# Patient Record
Sex: Male | Born: 1935 | Race: Black or African American | Hispanic: No | Smoking: Former smoker
Health system: Southern US, Community
[De-identification: ages and names within clinical notes are randomized; demographics above are authoritative.]

## PROBLEM LIST (undated history)

## (undated) DIAGNOSIS — E7800 Pure hypercholesterolemia, unspecified: Secondary | ICD-10-CM

## (undated) DIAGNOSIS — I639 Cerebral infarction, unspecified: Secondary | ICD-10-CM

## (undated) DIAGNOSIS — I739 Peripheral vascular disease, unspecified: Secondary | ICD-10-CM

## (undated) DIAGNOSIS — G8929 Other chronic pain: Secondary | ICD-10-CM

## (undated) DIAGNOSIS — M109 Gout, unspecified: Secondary | ICD-10-CM

## (undated) DIAGNOSIS — I509 Heart failure, unspecified: Secondary | ICD-10-CM

## (undated) DIAGNOSIS — M1990 Unspecified osteoarthritis, unspecified site: Secondary | ICD-10-CM

## (undated) DIAGNOSIS — M545 Low back pain, unspecified: Secondary | ICD-10-CM

## (undated) DIAGNOSIS — I10 Essential (primary) hypertension: Secondary | ICD-10-CM

## (undated) DIAGNOSIS — E119 Type 2 diabetes mellitus without complications: Secondary | ICD-10-CM

## (undated) DIAGNOSIS — K219 Gastro-esophageal reflux disease without esophagitis: Secondary | ICD-10-CM

## (undated) DIAGNOSIS — G4730 Sleep apnea, unspecified: Secondary | ICD-10-CM

## (undated) DIAGNOSIS — N289 Disorder of kidney and ureter, unspecified: Secondary | ICD-10-CM

## (undated) DIAGNOSIS — R0602 Shortness of breath: Secondary | ICD-10-CM

## (undated) HISTORY — PX: EYE SURGERY: SHX253

## (undated) HISTORY — PX: CHOLECYSTECTOMY: SHX55

## (undated) HISTORY — PX: COLONOSCOPY: SHX174

## (undated) HISTORY — PX: HUMERUS FRACTURE SURGERY: SHX670

## (undated) HISTORY — PX: PERITONEAL CATHETER INSERTION: SHX2223

---

## 2005-11-03 ENCOUNTER — Ambulatory Visit: Payer: Self-pay | Admitting: Cardiology

## 2011-02-22 HISTORY — PX: ARTERIOVENOUS GRAFT PLACEMENT: SUR1029

## 2011-02-26 ENCOUNTER — Ambulatory Visit (INDEPENDENT_AMBULATORY_CARE_PROVIDER_SITE_OTHER): Payer: Medicare Other | Admitting: Vascular Surgery

## 2011-02-26 ENCOUNTER — Encounter (INDEPENDENT_AMBULATORY_CARE_PROVIDER_SITE_OTHER): Payer: Medicare Other

## 2011-02-26 DIAGNOSIS — N189 Chronic kidney disease, unspecified: Secondary | ICD-10-CM

## 2011-02-26 DIAGNOSIS — Z01810 Encounter for preprocedural cardiovascular examination: Secondary | ICD-10-CM

## 2011-02-26 DIAGNOSIS — N186 End stage renal disease: Secondary | ICD-10-CM

## 2011-03-02 NOTE — Assessment & Plan Note (Signed)
OFFICE VISIT  [name], [name] DOB:  09/29/1934                                       02/26/2011 EXBMW#:[id number]  CHIEF COMPLAINT:  Needs dialysis access.  HISTORY OF PRESENT ILLNESS:  The patient is a 77 year old male referred by Dr. Kristian Covey for placement of a long-term hemodialysis access.  He is left-handed.  He has severe immobility of his right arm secondary to two previous fractures in the arm with poor healing.  He has minimal range of motion actively in the right arm.  OTHER CHRONIC MEDICAL PROBLEMS:  Diabetes, hypertension, elevated cholesterol, congestive heart failure.  The patient is currently not on dialysis.  PAST SURGICAL HISTORY:  Cholecystectomy.  SOCIAL HISTORY:  He is single.  He has two children.  He is retired.  He is a former smoker but quit 35 years ago.  FAMILY HISTORY:  Not remarkable for renal failure or early vascular disease.  REVIEW OF SYSTEMS:  He has multiple-joint arthritis pain.  He also has some shortness of breath with exertion.  He is 5 feet 7 inches, 229 pounds.  All other systems were negative.  Please see intake referral form for details.  MEDICATIONS:  Losartan, omeprazole, furosemide, loratadine, simvastatin, allopurinol, nifedipine, atenolol, Reglan, tramadol, vitamin D, sodium bicarbonate, Plavix 75 mg once a day, aspirin 325 mg once a day.  ALLERGIES:  He has allergy listed to Lortab.  PHYSICAL EXAMINATION:  Blood pressure is 123/74 in the left arm, heart rate is 57 and regular, respirations 18.  HEENT:  Unremarkable.  Neck has 2+ carotid pulses without bruit.  Chest is clear to auscultation. Abdomen is obese, soft, nontender, nondistended.  Cardiac:  Regular rate and rhythm without murmur.  Extremities:  The right upper extremity is able to passively abduct but not fully, and he is not really able to extend the arm enough that I think it would be easy to access a dialysis graft in the right arm.   The left upper extremity has 2+ brachial and 1+ radial pulse.  Neurologic:  He has 2/5 strength in the right upper extremity.  Left upper extremity and lower extremities are 5/5 motor strength.  Skin has no open ulcers or rashes.  He had a vein mapping ultrasound today, which showed that the cephalic vein and basilic veins are fairly marginal in the upper extremities, neither of which is suitable for fistula creation.  I believe the best option for the patient will be placement of the left arm AV graft.  This potentially would be an upper arm graft.  We have scheduled this for him on March 10, 2011.  I discussed with the patient and his son-in-law today risks, benefits and possible complications of the procedures, including but not limited to bleeding, infection, graft thrombosis, ischemic steal.  They understand and agree to proceed.    [name]. Noah Eastland, MD Electronically Signed  CEF/MEDQ  D:  02/27/2011  T:  03/02/2011  Job:  [id number]  cc:   Jorja Loa, M.D.

## 2011-03-05 ENCOUNTER — Other Ambulatory Visit (HOSPITAL_COMMUNITY): Payer: Medicare Other

## 2011-03-09 ENCOUNTER — Encounter (HOSPITAL_COMMUNITY)
Admission: RE | Admit: 2011-03-09 | Discharge: 2011-03-09 | Disposition: A | Discharge status: Home / Self Care | Payer: Medicare Other | Source: Ambulatory Visit | Attending: Vascular Surgery | Admitting: Vascular Surgery

## 2011-03-09 ENCOUNTER — Ambulatory Visit (HOSPITAL_COMMUNITY)
Admission: RE | Admit: 2011-03-09 | Discharge: 2011-03-09 | Disposition: A | Discharge status: Home / Self Care | Payer: Medicare Other | Source: Ambulatory Visit | Attending: Vascular Surgery | Admitting: Vascular Surgery

## 2011-03-09 ENCOUNTER — Other Ambulatory Visit: Payer: Self-pay | Admitting: Vascular Surgery

## 2011-03-09 DIAGNOSIS — Z01811 Encounter for preprocedural respiratory examination: Secondary | ICD-10-CM | POA: Insufficient documentation

## 2011-03-09 DIAGNOSIS — I10 Essential (primary) hypertension: Secondary | ICD-10-CM | POA: Insufficient documentation

## 2011-03-09 DIAGNOSIS — N186 End stage renal disease: Secondary | ICD-10-CM

## 2011-03-09 DIAGNOSIS — R0602 Shortness of breath: Secondary | ICD-10-CM | POA: Insufficient documentation

## 2011-03-09 DIAGNOSIS — Z01812 Encounter for preprocedural laboratory examination: Secondary | ICD-10-CM | POA: Insufficient documentation

## 2011-03-09 DIAGNOSIS — I517 Cardiomegaly: Secondary | ICD-10-CM | POA: Insufficient documentation

## 2011-03-09 DIAGNOSIS — Z01810 Encounter for preprocedural cardiovascular examination: Secondary | ICD-10-CM | POA: Insufficient documentation

## 2011-03-09 LAB — SURGICAL PCR SCREEN: Staphylococcus aureus: NEGATIVE

## 2011-03-09 LAB — BASIC METABOLIC PANEL
BUN: 94 mg/dL — ABNORMAL HIGH (ref 6–23)
CO2: 16 mEq/L — ABNORMAL LOW (ref 19–32)
GFR calc non Af Amer: 12 mL/min — ABNORMAL LOW (ref >60)
Glucose, Bld: 215 mg/dL — ABNORMAL HIGH (ref 70–99)
Potassium: 5.6 mEq/L — ABNORMAL HIGH (ref 3.5–5.1)

## 2011-03-09 LAB — PROTIME-INR
INR: 1 (ref 0.00–1.49)
Prothrombin Time: 13.4 seconds (ref 11.6–15.2)

## 2011-03-09 LAB — CBC
MCHC: 33.6 g/dL (ref 30.0–36.0)
RDW: 13.8 % (ref 11.5–15.5)

## 2011-03-10 ENCOUNTER — Ambulatory Visit (HOSPITAL_COMMUNITY)
Admission: RE | Admit: 2011-03-10 | Discharge: 2011-03-10 | Disposition: A | Discharge status: Home / Self Care | Payer: Medicare Other | Source: Ambulatory Visit | Attending: Vascular Surgery | Admitting: Vascular Surgery

## 2011-03-10 DIAGNOSIS — M1990 Unspecified osteoarthritis, unspecified site: Secondary | ICD-10-CM | POA: Insufficient documentation

## 2011-03-10 DIAGNOSIS — I120 Hypertensive chronic kidney disease with stage 5 chronic kidney disease or end stage renal disease: Secondary | ICD-10-CM

## 2011-03-10 DIAGNOSIS — I509 Heart failure, unspecified: Secondary | ICD-10-CM | POA: Insufficient documentation

## 2011-03-10 DIAGNOSIS — N186 End stage renal disease: Secondary | ICD-10-CM | POA: Insufficient documentation

## 2011-03-10 DIAGNOSIS — E7800 Pure hypercholesterolemia, unspecified: Secondary | ICD-10-CM | POA: Insufficient documentation

## 2011-03-10 DIAGNOSIS — Z01812 Encounter for preprocedural laboratory examination: Secondary | ICD-10-CM | POA: Insufficient documentation

## 2011-03-10 DIAGNOSIS — Z7902 Long term (current) use of antithrombotics/antiplatelets: Secondary | ICD-10-CM | POA: Insufficient documentation

## 2011-03-10 DIAGNOSIS — E119 Type 2 diabetes mellitus without complications: Secondary | ICD-10-CM | POA: Insufficient documentation

## 2011-03-10 DIAGNOSIS — Z7982 Long term (current) use of aspirin: Secondary | ICD-10-CM | POA: Insufficient documentation

## 2011-03-10 DIAGNOSIS — I10 Essential (primary) hypertension: Secondary | ICD-10-CM | POA: Insufficient documentation

## 2011-03-10 DIAGNOSIS — Z79899 Other long term (current) drug therapy: Secondary | ICD-10-CM | POA: Insufficient documentation

## 2011-03-10 DIAGNOSIS — K219 Gastro-esophageal reflux disease without esophagitis: Secondary | ICD-10-CM | POA: Insufficient documentation

## 2011-03-10 LAB — POCT I-STAT 4, (NA,K, GLUC, HGB,HCT): Potassium: 5.5 mEq/L — ABNORMAL HIGH (ref 3.5–5.1)

## 2011-03-10 LAB — GLUCOSE, CAPILLARY
Glucose-Capillary: 106 mg/dL — ABNORMAL HIGH (ref 70–99)
Glucose-Capillary: 110 mg/dL — ABNORMAL HIGH (ref 70–99)

## 2011-03-12 NOTE — Procedures (Unsigned)
CEPHALIC VEIN MAPPING  INDICATION:  Preoperative vein mapping for AVF placement.  HISTORY: End-stage renal disease, diabetes, hypertension, hyperlipidemia, CHF.  EXAM: The right cephalic vein is compressible.  Diameter measurements range from 0.08 to 0.17 cm.  The right basilic vein is not visualized.  The left cephalic vein is compressible.  Diameter measurements range from 0.09 to 0.10 cm.  The left basilic vein is compressible.  Diameter measurements range from 0.23 to 0.34 cm.  See attached worksheet for all measurements.  IMPRESSION:  Patent right cephalic and left cephalic vein and basilic veins with diameter measurements as described above.  ___________________________________________ Janetta Hora. Darrick Penna, MD  EM/MEDQ  D:  02/26/2011  T:  02/26/2011  Job:  [id number]

## 2011-03-16 NOTE — Op Note (Signed)
[name], [name]                 ACCOUNT NO.:  [id number]  MEDICAL RECORD NO.:  [id number]           PATIENT TYPE:  O  LOCATION:  [facility]                         FACILITY:  [facility]  PHYSICIAN:  Janetta Hora. Fields, MD  DATE OF BIRTH:  1934/08/30  DATE OF PROCEDURE:  03/10/2011 DATE OF DISCHARGE:  03/10/2011                              OPERATIVE REPORT   PROCEDURE:  Left forearm AV graft.  PREOPERATIVE DIAGNOSIS:  End-stage renal disease.  POSTOPERATIVE DIAGNOSIS:  End-stage renal disease.  ANESTHESIA:  Local with IV sedation.  ASSISTANT:  Newton Pigg, PA-C  OPERATIVE FINDINGS: 1. A 4-7-mm tapered PTFE graft. 2. A 3.5-mm outflow vein. 3. A 3-mm brachial artery.  OPERATIVE DETAILS:  After obtaining informed consent, the patient was taken to the operating room.  The patient was placed in supine position on the operating table.  After adequate sedation, the patient's entire left upper extremity prepped and draped in usual sterile fashion.  Local anesthesia was infiltrated near the antecubital crease.  A longitudinal incision was made in this location, carried down through the subcutaneous tissues down to level of brachial artery.  Brachial artery was of good quality, although slightly thickened.  It was approximately 3 mm in diameter.  Next, the adjacent deep brachial/basilic vein was dissected free circumferentially and was also of good quality approximately 3 mm in diameter.  A small side branches were ligated and divided between silk ties.  Next, a subcutaneous tunnel was created in a loop configuration of the forearm with a transverse incision in the distal forearm for assistance in tunneling.  A 4-7-mm tapered PTFE graft was then brought through the subcutaneous tunnel.  The 4 mm end of the graft was on the radial aspect of the forearm.  The patient was given 5000 units of intravenous heparin.  Vessel loops were used to control brachial artery proximally and distally  after 2 minutes circulation time.  Longitudinal opening was made in the brachial artery.  The 4 mm end of the graft was beveled slightly and sewn end of graft to side of artery using a running 6-0 Prolene suture.  Just prior to completion of anastomosis, it was fore bled, back bled, and thoroughly flushed. Anastomosis was secured.  Vessel loops were released, showed good pulsatile flow in the graft immediately.  Graft was pulled taut to length.  The vein was then controlled proximally and distally with fine bulldog clamps.  A longitudinal opening was made in the vein and the graft sewn end of graft to side of vein using a running 6-0 Prolene suture.  Just prior to completion anastomosis, it was fore bled, back bled, and thoroughly flushed.  Anastomosis was secured.  The clamps were released.  There was palpable thrill above the graft immediately. Hemostasis was obtained with administration of 50 mg of protamine.  Both incisions were then closed with a running 3-0 Vicryl subcutaneous stitch and a 4-0 Vicryl subcuticular stitch in the skin.  Dermabond was applied to the incisions.  The patient had audible Doppler flow in the radial artery at the end of the case and this augmented approximately  25% with clamping of the graft.  The patient tolerated the procedure well and there were no complications.  Sponge and needle counts were correct at the end of the case.  The patient taken to recovery room in stable condition.     Janetta Hora. Fields, MD     CEF/MEDQ  D:  03/11/2011  T:  03/12/2011  Job:  [id number]  Electronically Signed by Fabienne Bruns MD on 03/16/2011 11:07:53 AM

## 2011-03-26 ENCOUNTER — Ambulatory Visit: Payer: Medicare Other

## 2011-04-29 ENCOUNTER — Ambulatory Visit (INDEPENDENT_AMBULATORY_CARE_PROVIDER_SITE_OTHER): Payer: Medicare Other

## 2011-04-29 DIAGNOSIS — N186 End stage renal disease: Secondary | ICD-10-CM

## 2011-04-29 NOTE — Assessment & Plan Note (Signed)
OFFICE VISIT  [name], [name] DOB:  1934-08-02                                       04/29/2011 NWGNF#:[id number]  HISTORY OF PRESENT ILLNESS:  The patient is a 77 year old gentleman who had a left forearm loop AV Gore-Tex graft placed by Dr. Darrick Penna on March 10, 2011.  The patient is doing well.  He has no signs of steal.  He can use his hand normally.  He states he has no numbness or tingling issues. Has an occasional twinge in the hand but no pain in the hand.  He has normal motion and sensation.  PHYSICAL EXAMINATION:  General:  This is a well-developed, well- nourished gentleman in no acute distress.  He uses a cane to walk. Vital signs:  Heart rate was 74, saturations are 100%, his respiratory rate is 12.  Left forearm AV Gore-Tex graft:  His small wounds were healing well.  He had a positive thrill and bruit in the graft.  His hand was warm.  He had a 1+ radial pulse.  He had good sensation and motion in the fingers.  ASSESSMENT AND PLAN:  Functioning left forearm loop arteriovenous Gore- Tex graft, which is ready for use if the patient needs hemodialysis in the near future.  The patient is presently not on hemodialysis.  He is a patient of Dr. Kristian Covey in [place].  Della Goo, PA-C  [place]. Edilia Bo, M.D. Electronically Signed  RR/MEDQ  D:  04/29/2011  T:  04/29/2011  Job:  [id number]  cc:   Jorja Loa, M.D.

## 2011-07-31 ENCOUNTER — Other Ambulatory Visit (HOSPITAL_COMMUNITY): Payer: Self-pay | Admitting: Nephrology

## 2011-07-31 DIAGNOSIS — N186 End stage renal disease: Secondary | ICD-10-CM

## 2011-08-01 ENCOUNTER — Ambulatory Visit (HOSPITAL_COMMUNITY)
Admission: RE | Admit: 2011-08-01 | Discharge: 2011-08-01 | Disposition: A | Discharge status: Home / Self Care | Payer: Medicare Other | Source: Ambulatory Visit | Attending: Nephrology | Admitting: Nephrology

## 2011-08-01 ENCOUNTER — Other Ambulatory Visit (HOSPITAL_COMMUNITY): Payer: Self-pay | Admitting: Nephrology

## 2011-08-01 DIAGNOSIS — N186 End stage renal disease: Secondary | ICD-10-CM

## 2011-08-01 DIAGNOSIS — T82898A Other specified complication of vascular prosthetic devices, implants and grafts, initial encounter: Secondary | ICD-10-CM | POA: Insufficient documentation

## 2011-08-01 DIAGNOSIS — Y849 Medical procedure, unspecified as the cause of abnormal reaction of the patient, or of later complication, without mention of misadventure at the time of the procedure: Secondary | ICD-10-CM | POA: Insufficient documentation

## 2011-08-01 LAB — POTASSIUM: Potassium: 4.3 mEq/L (ref 3.5–5.1)

## 2011-08-01 LAB — POCT I-STAT 4, (NA,K, GLUC, HGB,HCT)
HCT: 35 % — ABNORMAL LOW (ref 39.0–52.0)
Sodium: 139 mEq/L (ref 135–145)

## 2011-08-01 MED ORDER — IOHEXOL 300 MG/ML  SOLN
100.0000 mL | Freq: Once | INTRAMUSCULAR | Status: AC | PRN
  Administered 2011-08-01: 60 mL via INTRAVENOUS

## 2011-09-07 ENCOUNTER — Other Ambulatory Visit (HOSPITAL_COMMUNITY): Payer: Self-pay | Admitting: Nephrology

## 2011-09-07 DIAGNOSIS — N186 End stage renal disease: Secondary | ICD-10-CM

## 2011-09-08 ENCOUNTER — Other Ambulatory Visit (HOSPITAL_COMMUNITY): Payer: Self-pay | Admitting: Nephrology

## 2011-09-08 ENCOUNTER — Ambulatory Visit (HOSPITAL_COMMUNITY)
Admission: RE | Admit: 2011-09-08 | Discharge: 2011-09-08 | Disposition: A | Discharge status: Home / Self Care | Payer: Medicare Other | Source: Ambulatory Visit | Attending: Nephrology | Admitting: Nephrology

## 2011-09-08 DIAGNOSIS — E669 Obesity, unspecified: Secondary | ICD-10-CM | POA: Insufficient documentation

## 2011-09-08 DIAGNOSIS — N186 End stage renal disease: Secondary | ICD-10-CM | POA: Insufficient documentation

## 2011-09-08 DIAGNOSIS — G4733 Obstructive sleep apnea (adult) (pediatric): Secondary | ICD-10-CM | POA: Insufficient documentation

## 2011-09-08 DIAGNOSIS — Y849 Medical procedure, unspecified as the cause of abnormal reaction of the patient, or of later complication, without mention of misadventure at the time of the procedure: Secondary | ICD-10-CM | POA: Insufficient documentation

## 2011-09-08 DIAGNOSIS — T82898A Other specified complication of vascular prosthetic devices, implants and grafts, initial encounter: Secondary | ICD-10-CM | POA: Insufficient documentation

## 2011-09-08 MED ORDER — IOHEXOL 300 MG/ML  SOLN
100.0000 mL | Freq: Once | INTRAMUSCULAR | Status: AC | PRN
  Administered 2011-09-08: 44 mL via INTRAVENOUS

## 2012-02-29 ENCOUNTER — Other Ambulatory Visit: Payer: Self-pay | Admitting: Vascular Surgery

## 2013-07-05 DIAGNOSIS — R079 Chest pain, unspecified: Secondary | ICD-10-CM

## 2013-10-16 ENCOUNTER — Other Ambulatory Visit (HOSPITAL_COMMUNITY): Payer: Self-pay | Admitting: Nephrology

## 2013-10-16 DIAGNOSIS — Z992 Dependence on renal dialysis: Secondary | ICD-10-CM

## 2013-10-16 DIAGNOSIS — E119 Type 2 diabetes mellitus without complications: Secondary | ICD-10-CM

## 2013-10-16 DIAGNOSIS — N186 End stage renal disease: Secondary | ICD-10-CM

## 2013-10-16 DIAGNOSIS — T8249XS Other complication of vascular dialysis catheter, sequela: Secondary | ICD-10-CM

## 2013-10-16 DIAGNOSIS — T82868A Thrombosis of vascular prosthetic devices, implants and grafts, initial encounter: Secondary | ICD-10-CM

## 2013-10-17 ENCOUNTER — Ambulatory Visit (HOSPITAL_COMMUNITY)
Admission: RE | Admit: 2013-10-17 | Discharge: 2013-10-17 | Disposition: A | Discharge status: Home / Self Care | Payer: Medicare Other | Source: Ambulatory Visit | Attending: Nephrology | Admitting: Nephrology

## 2013-10-17 DIAGNOSIS — T82868A Thrombosis of vascular prosthetic devices, implants and grafts, initial encounter: Secondary | ICD-10-CM

## 2013-10-17 MED ORDER — ALTEPLASE 2 MG IJ SOLR
4.0000 mg | Freq: Once | INTRAMUSCULAR | Status: DC
  Filled 2013-10-17: qty 4

## 2013-10-17 NOTE — Progress Notes (Signed)
Mr. [name] has arrived today to [facility] radiology nurse's station for a requested Left forearm AVG declot procedure. The patient currently uses peritoneal dialysis and has not used his AVG since 02/2013 with no plans to start using this. He was sent here today for a declot procedure to have an additional access site in case his peritoneal access ever failed. The risk of attempting to open this graft after such a prolonged period with no use was discussed with the patient and his son. The graft would also be at increased risk for restenosis since it will not be used. I have spoke with Dr. Archer Asa today about this and he agrees with the above. I have also called Dr. Susa Griffins covering partner as he is off today. All parties are in agreement that this procedure the risks outweighs the benefit. The procedure will be cancelled and patient is being discharged home with his son.  Pattricia Boss PA-C Interventional Radiology  10/17/13  1:01 PM

## 2014-06-12 ENCOUNTER — Other Ambulatory Visit (HOSPITAL_COMMUNITY): Payer: Self-pay | Admitting: Nephrology

## 2014-06-12 DIAGNOSIS — Z992 Dependence on renal dialysis: Principal | ICD-10-CM

## 2014-06-12 DIAGNOSIS — N186 End stage renal disease: Secondary | ICD-10-CM

## 2014-06-13 ENCOUNTER — Other Ambulatory Visit (HOSPITAL_COMMUNITY): Payer: Self-pay | Admitting: Nephrology

## 2014-06-13 ENCOUNTER — Encounter (HOSPITAL_COMMUNITY): Payer: Self-pay

## 2014-06-13 ENCOUNTER — Observation Stay: Admission: AD | Admit: 2014-06-13 | Payer: Self-pay | Source: Ambulatory Visit | Admitting: Internal Medicine

## 2014-06-13 ENCOUNTER — Other Ambulatory Visit: Payer: Self-pay | Admitting: Radiology

## 2014-06-13 ENCOUNTER — Inpatient Hospital Stay (HOSPITAL_COMMUNITY)
Admission: RE | Admit: 2014-06-13 | Discharge: 2014-06-17 | DRG: 683 | Disposition: A | Discharge status: Home / Self Care | Payer: Medicare Other | Source: Ambulatory Visit | Attending: Internal Medicine | Admitting: Internal Medicine

## 2014-06-13 VITALS — BP 170/77 | HR 79 | Temp 98.6°F | Resp 19 | Ht 68.0 in | Wt 243.3 lb

## 2014-06-13 DIAGNOSIS — T82898A Other specified complication of vascular prosthetic devices, implants and grafts, initial encounter: Secondary | ICD-10-CM

## 2014-06-13 DIAGNOSIS — E119 Type 2 diabetes mellitus without complications: Secondary | ICD-10-CM | POA: Diagnosis present

## 2014-06-13 DIAGNOSIS — Z7982 Long term (current) use of aspirin: Secondary | ICD-10-CM

## 2014-06-13 DIAGNOSIS — E1169 Type 2 diabetes mellitus with other specified complication: Secondary | ICD-10-CM | POA: Diagnosis present

## 2014-06-13 DIAGNOSIS — I120 Hypertensive chronic kidney disease with stage 5 chronic kidney disease or end stage renal disease: Secondary | ICD-10-CM | POA: Diagnosis present

## 2014-06-13 DIAGNOSIS — N289 Disorder of kidney and ureter, unspecified: Secondary | ICD-10-CM | POA: Insufficient documentation

## 2014-06-13 DIAGNOSIS — E1129 Type 2 diabetes mellitus with other diabetic kidney complication: Secondary | ICD-10-CM

## 2014-06-13 DIAGNOSIS — N186 End stage renal disease: Principal | ICD-10-CM | POA: Diagnosis present

## 2014-06-13 DIAGNOSIS — I739 Peripheral vascular disease, unspecified: Secondary | ICD-10-CM | POA: Diagnosis present

## 2014-06-13 DIAGNOSIS — E876 Hypokalemia: Secondary | ICD-10-CM | POA: Diagnosis present

## 2014-06-13 DIAGNOSIS — I10 Essential (primary) hypertension: Secondary | ICD-10-CM | POA: Diagnosis present

## 2014-06-13 DIAGNOSIS — N189 Chronic kidney disease, unspecified: Secondary | ICD-10-CM

## 2014-06-13 DIAGNOSIS — D539 Nutritional anemia, unspecified: Secondary | ICD-10-CM | POA: Diagnosis present

## 2014-06-13 DIAGNOSIS — Z87891 Personal history of nicotine dependence: Secondary | ICD-10-CM

## 2014-06-13 DIAGNOSIS — Z992 Dependence on renal dialysis: Principal | ICD-10-CM

## 2014-06-13 DIAGNOSIS — I509 Heart failure, unspecified: Secondary | ICD-10-CM | POA: Diagnosis present

## 2014-06-13 DIAGNOSIS — E1122 Type 2 diabetes mellitus with diabetic chronic kidney disease: Secondary | ICD-10-CM

## 2014-06-13 DIAGNOSIS — Z8673 Personal history of transient ischemic attack (TIA), and cerebral infarction without residual deficits: Secondary | ICD-10-CM

## 2014-06-13 DIAGNOSIS — T85611A Breakdown (mechanical) of intraperitoneal dialysis catheter, initial encounter: Secondary | ICD-10-CM

## 2014-06-13 HISTORY — DX: Low back pain, unspecified: M54.50

## 2014-06-13 HISTORY — DX: Pure hypercholesterolemia, unspecified: E78.00

## 2014-06-13 HISTORY — DX: Gout, unspecified: M10.9

## 2014-06-13 HISTORY — DX: Other chronic pain: G89.29

## 2014-06-13 HISTORY — DX: Low back pain: M54.5

## 2014-06-13 HISTORY — DX: Cerebral infarction, unspecified: I63.9

## 2014-06-13 HISTORY — DX: Type 2 diabetes mellitus without complications: E11.9

## 2014-06-13 HISTORY — DX: Disorder of kidney and ureter, unspecified: N28.9

## 2014-06-13 HISTORY — DX: Essential (primary) hypertension: I10

## 2014-06-13 HISTORY — DX: Heart failure, unspecified: I50.9

## 2014-06-13 HISTORY — DX: Unspecified osteoarthritis, unspecified site: M19.90

## 2014-06-13 HISTORY — DX: Gastro-esophageal reflux disease without esophagitis: K21.9

## 2014-06-13 HISTORY — DX: Peripheral vascular disease, unspecified: I73.9

## 2014-06-13 LAB — APTT: aPTT: 33 seconds (ref 24–37)

## 2014-06-13 LAB — CBC
HCT: 26.8 % — ABNORMAL LOW (ref 39.0–52.0)
Hemoglobin: 8.7 g/dL — ABNORMAL LOW (ref 13.0–17.0)
MCH: 33.2 pg (ref 26.0–34.0)
MCHC: 32.5 g/dL (ref 30.0–36.0)
MCV: 102.3 fL — AB (ref 78.0–100.0)
PLATELETS: 317 10*3/uL (ref 150–400)
RBC: 2.62 MIL/uL — AB (ref 4.22–5.81)
RDW: 14.2 % (ref 11.5–15.5)
WBC: 8.1 10*3/uL (ref 4.0–10.5)

## 2014-06-13 LAB — GLUCOSE, CAPILLARY
GLUCOSE-CAPILLARY: 65 mg/dL — AB (ref 70–99)
Glucose-Capillary: 71 mg/dL (ref 70–99)
Glucose-Capillary: 99 mg/dL (ref 70–99)
Glucose-Capillary: 74 mg/dL (ref 70–99)
Glucose-Capillary: 130 mg/dL — ABNORMAL HIGH (ref 70–99)
Glucose-Capillary: 181 mg/dL — ABNORMAL HIGH (ref 70–99)

## 2014-06-13 LAB — PROTIME-INR
INR: 1.08 (ref 0.00–1.49)
PROTHROMBIN TIME: 14 s (ref 11.6–15.2)

## 2014-06-13 LAB — BASIC METABOLIC PANEL
Anion gap: 13 (ref 5–15)
BUN: 29 mg/dL — ABNORMAL HIGH (ref 6–23)
CALCIUM: 7.5 mg/dL — AB (ref 8.4–10.5)
CO2: 31 mEq/L (ref 19–32)
Chloride: 95 mEq/L — ABNORMAL LOW (ref 96–112)
Creatinine, Ser: 7.25 mg/dL — ABNORMAL HIGH (ref 0.50–1.35)
GFR, EST AFRICAN AMERICAN: 7 mL/min — AB (ref >90)
GFR, EST NON AFRICAN AMERICAN: 6 mL/min — AB (ref >90)
GLUCOSE: 71 mg/dL (ref 70–99)
POTASSIUM: 3.3 meq/L — AB (ref 3.7–5.3)
SODIUM: 139 meq/L (ref 137–147)

## 2014-06-13 MED ORDER — FUROSEMIDE 80 MG PO TABS
120.0000 mg | ORAL_TABLET | Freq: Every day | ORAL | Status: DC
  Administered 2014-06-14 – 2014-06-17 (×4): 120 mg via ORAL
  Filled 2014-06-13 (×4): qty 1

## 2014-06-13 MED ORDER — CARVEDILOL 25 MG PO TABS
25.0000 mg | ORAL_TABLET | Freq: Two times a day (BID) | ORAL | Status: DC
  Administered 2014-06-14 – 2014-06-17 (×6): 25 mg via ORAL
  Filled 2014-06-13 (×9): qty 1

## 2014-06-13 MED ORDER — HEPARIN SODIUM (PORCINE) 5000 UNIT/ML IJ SOLN
5000.0000 [IU] | Freq: Three times a day (TID) | INTRAMUSCULAR | Status: DC
  Administered 2014-06-14 – 2014-06-17 (×10): 5000 [IU] via SUBCUTANEOUS
  Filled 2014-06-13 (×13): qty 1

## 2014-06-13 MED ORDER — OXYCODONE-ACETAMINOPHEN 5-325 MG PO TABS
ORAL_TABLET | ORAL | Status: AC
  Administered 2014-06-13: 1 via ORAL
  Filled 2014-06-13: qty 1

## 2014-06-13 MED ORDER — ALLOPURINOL 100 MG PO TABS
100.0000 mg | ORAL_TABLET | Freq: Every day | ORAL | Status: DC
  Administered 2014-06-13 – 2014-06-17 (×5): 100 mg via ORAL
  Filled 2014-06-13 (×5): qty 1

## 2014-06-13 MED ORDER — ZOLPIDEM TARTRATE 5 MG PO TABS
2.5000 mg | ORAL_TABLET | Freq: Every evening | ORAL | Status: DC | PRN
  Administered 2014-06-13: 2.5 mg via ORAL
  Filled 2014-06-13: qty 1

## 2014-06-13 MED ORDER — LIDOCAINE-PRILOCAINE 2.5-2.5 % EX CREA: 1.0000 "application " | TOPICAL_CREAM | CUTANEOUS | Status: DC | PRN

## 2014-06-13 MED ORDER — NITROGLYCERIN 0.2 MG/HR TD PT24
0.2000 mg | MEDICATED_PATCH | TRANSDERMAL | Status: DC
  Administered 2014-06-13 – 2014-06-16 (×5): 0.2 mg via TRANSDERMAL
  Filled 2014-06-13 (×6): qty 1

## 2014-06-13 MED ORDER — INSULIN ASPART 100 UNIT/ML ~~LOC~~ SOLN
0.0000 [IU] | Freq: Three times a day (TID) | SUBCUTANEOUS | Status: DC
  Administered 2014-06-16: 3 [IU] via SUBCUTANEOUS
  Administered 2014-06-17 (×2): 2 [IU] via SUBCUTANEOUS

## 2014-06-13 MED ORDER — LIDOCAINE HCL (PF) 1 % IJ SOLN: 5.0000 mL | INTRAMUSCULAR | Status: DC | PRN

## 2014-06-13 MED ORDER — SODIUM CHLORIDE 0.9 % IV SOLN: 100.0000 mL | INTRAVENOUS | Status: DC | PRN

## 2014-06-13 MED ORDER — NEPRO/CARBSTEADY PO LIQD: 237.0000 mL | ORAL | Status: DC | PRN

## 2014-06-13 MED ORDER — HEPARIN SODIUM (PORCINE) 1000 UNIT/ML DIALYSIS: 20.0000 [IU]/kg | INTRAMUSCULAR | Status: DC | PRN

## 2014-06-13 MED ORDER — VANCOMYCIN HCL IN DEXTROSE 1-5 GM/200ML-% IV SOLN
1000.0000 mg | Freq: Once | INTRAVENOUS | Status: AC
  Administered 2014-06-13: 1000 mg via INTRAVENOUS
  Filled 2014-06-13 (×2): qty 200

## 2014-06-13 MED ORDER — SODIUM CHLORIDE 0.9 % IV SOLN: Freq: Once | INTRAVENOUS | Status: DC

## 2014-06-13 MED ORDER — CEFAZOLIN SODIUM-DEXTROSE 2-3 GM-% IV SOLR: 2.0000 g | Freq: Once | INTRAVENOUS | Status: DC

## 2014-06-13 MED ORDER — SIMVASTATIN 40 MG PO TABS
40.0000 mg | ORAL_TABLET | Freq: Every day | ORAL | Status: DC
  Administered 2014-06-13 – 2014-06-14 (×2): 40 mg via ORAL
  Filled 2014-06-13 (×3): qty 1

## 2014-06-13 MED ORDER — ALTEPLASE 2 MG IJ SOLR: 2.0000 mg | Freq: Once | INTRAMUSCULAR | Status: DC | PRN

## 2014-06-13 MED ORDER — TIZANIDINE HCL 2 MG PO TABS
2.0000 mg | ORAL_TABLET | Freq: Every evening | ORAL | Status: DC | PRN
  Filled 2014-06-13: qty 1

## 2014-06-13 MED ORDER — ASPIRIN EC 81 MG PO TBEC
81.0000 mg | DELAYED_RELEASE_TABLET | Freq: Every day | ORAL | Status: DC
  Administered 2014-06-13 – 2014-06-17 (×5): 81 mg via ORAL
  Filled 2014-06-13 (×5): qty 1

## 2014-06-13 MED ORDER — DEXTROSE 50 % IV SOLN
INTRAVENOUS | Status: AC
  Administered 2014-06-13: 25 mL
  Filled 2014-06-13: qty 50

## 2014-06-13 MED ORDER — LORATADINE 10 MG PO TABS
10.0000 mg | ORAL_TABLET | Freq: Every day | ORAL | Status: DC
  Administered 2014-06-13 – 2014-06-17 (×5): 10 mg via ORAL
  Filled 2014-06-13 (×5): qty 1

## 2014-06-13 MED ORDER — HEPARIN SODIUM (PORCINE) 1000 UNIT/ML IJ SOLN
INTRAMUSCULAR | Status: AC
Start: 2014-06-13 — End: 2014-06-14
  Filled 2014-06-13: qty 1

## 2014-06-13 MED ORDER — CALCITRIOL 0.5 MCG PO CAPS
0.5000 ug | ORAL_CAPSULE | Freq: Every day | ORAL | Status: DC
  Administered 2014-06-13 – 2014-06-14 (×2): 0.5 ug via ORAL
  Filled 2014-06-13 (×3): qty 1

## 2014-06-13 MED ORDER — PENTAFLUOROPROP-TETRAFLUOROETH EX AERO: 1.0000 "application " | INHALATION_SPRAY | CUTANEOUS | Status: DC | PRN

## 2014-06-13 MED ORDER — SODIUM BICARBONATE 650 MG PO TABS
1300.0000 mg | ORAL_TABLET | Freq: Two times a day (BID) | ORAL | Status: DC
  Administered 2014-06-13 – 2014-06-15 (×4): 1300 mg via ORAL
  Filled 2014-06-13 (×6): qty 2

## 2014-06-13 MED ORDER — PANTOPRAZOLE SODIUM 40 MG PO TBEC
80.0000 mg | DELAYED_RELEASE_TABLET | Freq: Every day | ORAL | Status: DC
  Administered 2014-06-13 – 2014-06-17 (×5): 80 mg via ORAL
  Filled 2014-06-13 (×5): qty 2

## 2014-06-13 MED ORDER — GLIPIZIDE 10 MG PO TABS
10.0000 mg | ORAL_TABLET | Freq: Every day | ORAL | Status: DC
  Administered 2014-06-14: 10 mg via ORAL
  Filled 2014-06-13 (×2): qty 1

## 2014-06-13 MED ORDER — VANCOMYCIN HCL IN DEXTROSE 1-5 GM/200ML-% IV SOLN
INTRAVENOUS | Status: AC
  Administered 2014-06-13: 1000 mg via INTRAVENOUS
  Filled 2014-06-13: qty 200

## 2014-06-13 MED ORDER — OXYCODONE-ACETAMINOPHEN 5-325 MG PO TABS
1.0000 | ORAL_TABLET | Freq: Once | ORAL | Status: AC
  Administered 2014-06-13: 1 via ORAL

## 2014-06-13 MED ORDER — TRAMADOL HCL 50 MG PO TABS
50.0000 mg | ORAL_TABLET | Freq: Two times a day (BID) | ORAL | Status: DC | PRN
  Administered 2014-06-13 – 2014-06-14 (×2): 50 mg via ORAL
  Filled 2014-06-13 (×2): qty 1

## 2014-06-13 MED ORDER — DEXTROSE 50 % IV SOLN
25.0000 mL | Freq: Once | INTRAVENOUS | Status: AC | PRN
  Administered 2014-06-13: 25 mL via INTRAVENOUS

## 2014-06-13 MED ORDER — HEPARIN SODIUM (PORCINE) 1000 UNIT/ML DIALYSIS: 1000.0000 [IU] | INTRAMUSCULAR | Status: DC | PRN

## 2014-06-13 NOTE — H&P (Addendum)
[name] is an 80 y.o. male.   Chief Complaint: Pt has used at home peritoneal dialysis for yrs Has done well until recently Pts caregiver noticed wt gain and edema Noted SOB Pt has Rt arm dialysis graft- never used: clotted Pt was seen by Dr Hinda Lenis and now scheduled for temporary dialysis catheter placement Plan to continue peritoneal dialysis after further evaluation with Dr Hinda Lenis  HPI: HTN; CVA; CHF; PVD; DM; ESRD  Past Medical History  Diagnosis Date  . Hypertension   . Stroke   . CHF (congestive heart failure)   . Peripheral vascular disease   . Diabetes mellitus without complication   . Renal disease     No past surgical history on file.  No family history on file. Social History:  has no tobacco, alcohol, and drug history on file.  Allergies:  Allergies  Allergen Reactions  . Hydrocodone     unknown  . Penicillins     unknown     (Not in a hospital admission)  Results for orders placed during the hospital encounter of 06/13/14 (from the past 48 hour(s))  GLUCOSE, CAPILLARY     Status: None   Collection Time    06/13/14  9:34 AM      Result Value Ref Range   Glucose-Capillary 71  70 - 99 mg/dL  APTT     Status: None   Collection Time    06/13/14 10:04 AM      Result Value Ref Range   aPTT 33  24 - 37 seconds  BASIC METABOLIC PANEL     Status: Abnormal   Collection Time    06/13/14 10:04 AM      Result Value Ref Range   Sodium 139  137 - 147 mEq/L   Potassium 3.3 (*) 3.7 - 5.3 mEq/L   Chloride 95 (*) 96 - 112 mEq/L   CO2 31  19 - 32 mEq/L   Glucose, Bld 71  70 - 99 mg/dL   BUN 29 (*) 6 - 23 mg/dL   Creatinine, Ser 7.25 (*) 0.50 - 1.35 mg/dL   Calcium 7.5 (*) 8.4 - 10.5 mg/dL   GFR calc non Af Amer 6 (*) >90 mL/min   GFR calc Af Amer 7 (*) >90 mL/min   Comment: (NOTE)     The eGFR has been calculated using the CKD EPI equation.     This calculation has not been validated in all clinical situations.     eGFR's persistently <90 mL/min  signify possible Chronic Kidney     Disease.   Anion gap 13  5 - 15  CBC     Status: Abnormal   Collection Time    06/13/14 10:04 AM      Result Value Ref Range   WBC 8.1  4.0 - 10.5 K/uL   RBC 2.62 (*) 4.22 - 5.81 MIL/uL   Hemoglobin 8.7 (*) 13.0 - 17.0 g/dL   HCT 26.8 (*) 39.0 - 52.0 %   MCV 102.3 (*) 78.0 - 100.0 fL   MCH 33.2  26.0 - 34.0 pg   MCHC 32.5  30.0 - 36.0 g/dL   RDW 14.2  11.5 - 15.5 %   Platelets 317  150 - 400 K/uL  PROTIME-INR     Status: None   Collection Time    06/13/14 10:04 AM      Result Value Ref Range   Prothrombin Time 14.0  11.6 - 15.2 seconds   INR 1.08  0.00 -  1.49  GLUCOSE, CAPILLARY     Status: Abnormal   Collection Time    06/13/14 10:48 AM      Result Value Ref Range   Glucose-Capillary 65 (*) 70 - 99 mg/dL  GLUCOSE, CAPILLARY     Status: None   Collection Time    06/13/14 11:22 AM      Result Value Ref Range   Glucose-Capillary 99  70 - 99 mg/dL   No results found.  Review of Systems  Constitutional: Negative for fever.  Respiratory: Positive for cough and shortness of breath.   Cardiovascular: Positive for chest pain and orthopnea.  Gastrointestinal: Positive for abdominal pain. Negative for nausea.  Musculoskeletal: Positive for back pain and joint pain.  Neurological: Positive for weakness. Negative for headaches.  Psychiatric/Behavioral: Negative for substance abuse.    Blood pressure 185/81, pulse 74, temperature 98.3 F (36.8 C), temperature source Oral, resp. rate 18, height 5' 8"  (1.727 m), weight 113.399 kg (250 lb), SpO2 96.00%. Physical Exam  Constitutional: He is oriented to person, place, and time.  HENT:  Facial edema  Cardiovascular: Normal rate.   No murmur heard. Respiratory: Effort normal. He has wheezes.  GI: Soft. There is no tenderness.  Musculoskeletal: Normal range of motion. He exhibits edema.  All extremities very edematous  Neurological: He is alert and oriented to person, place, and time.  Skin:  Skin is warm and dry.  Psychiatric: He has a normal mood and affect. His behavior is normal. Judgment and thought content normal.     Assessment/Plan ESRD Peritoneal dialysis with caregiver for yrs Has done well til recently New onset edema and wt gain Has Rt arm dialysis graft- never used- clotted Evaluated with Dr Hinda Lenis Now scheduled for temporary HD cath for immediate use Pt aware of procedure benefits and risks and agreeable to  Proceed Consent signed and in chart  [name] A 06/13/2014, 11:24 AM

## 2014-06-13 NOTE — H&P (Signed)
[facility] History and Physical  Yohan Samons NGE:[id number] DOB: 10/20/1934 DOA: 06/13/2014  Referring physician: EDP PCP: Lisette Abu, MD   Chief Complaint: ESRD   HPI: [name] is a 80 y.o. male with ESRD, previously on peritoneal dialysis who is being switched over to HD due to PD not removing enough fluid from the patient.  He came in to the hospital today initially for placement of a tunneled catheter; however, this couldn't be performed due to patient being on plavix.  As a result, a non tunneled temp catheter was placed into his RIJ and HD performed via this.  Nephrology has called me to ask for admission for the patient because he cant really safely leave the hospital with an RIJ non tunneled catheter in place.  Review of Systems: Systems reviewed.  As above, otherwise negative  Past Medical History  Diagnosis Date  . Hypertension   . Stroke   . CHF (congestive heart failure)   . Peripheral vascular disease   . Diabetes mellitus without complication   . Renal disease    History reviewed. No pertinent past surgical history. Social History:  has no tobacco, alcohol, and drug history on file.  Allergies  Allergen Reactions  . Hydrocodone     unknown  . Penicillins     unknown    No family history on file.   Prior to Admission medications   Medication Sig Start Date End Date Taking? Authorizing Provider  allopurinol (ZYLOPRIM) 100 MG tablet Take 100 mg by mouth daily.   Yes Historical Provider, MD  aspirin EC 81 MG tablet Take 81 mg by mouth daily.   Yes Historical Provider, MD  calcitRIOL (ROCALTROL) 0.5 MCG capsule Take 0.5 mcg by mouth daily.   Yes Historical Provider, MD  carvedilol (COREG) 25 MG tablet Take 25 mg by mouth 2 (two) times daily with a meal.   Yes Historical Provider, MD  clopidogrel (PLAVIX) 75 MG tablet Take 75 mg by mouth daily.   Yes Historical Provider, MD  folic acid-vitamin b complex-vitamin c-selenium-zinc (DIALYVITE) 3 MG TABS tablet  Take 1 tablet by mouth daily.   Yes Historical Provider, MD  furosemide (LASIX) 40 MG tablet Take 120 mg by mouth daily.   Yes Historical Provider, MD  glipiZIDE (GLUCOTROL) 10 MG tablet Take 10 mg by mouth daily before breakfast.   Yes Historical Provider, MD  insulin lispro (HUMALOG) 100 UNIT/ML injection Inject 40 Units into the skin at bedtime.   Yes Historical Provider, MD  loratadine (CLARITIN) 10 MG tablet Take 10 mg by mouth daily.   Yes Historical Provider, MD  nitroGLYCERIN (NITRODUR - DOSED IN MG/24 HR) 0.2 mg/hr patch Place 0.2 mg onto the skin daily.   Yes Historical Provider, MD  omeprazole (PRILOSEC) 40 MG capsule Take 40 mg by mouth daily.   Yes Historical Provider, MD  potassium chloride 20 MEQ/15ML (10%) solution Take 30 mEq by mouth daily.   Yes Historical Provider, MD  simvastatin (ZOCOR) 40 MG tablet Take 40 mg by mouth daily.   Yes Historical Provider, MD  sodium bicarbonate 650 MG tablet Take 1,300 mg by mouth 2 (two) times daily.   Yes Historical Provider, MD  sorbitol 70 % solution Take 30 mLs by mouth daily as needed (constipation).   Yes Historical Provider, MD  tiZANidine (ZANAFLEX) 2 MG tablet Take 2 mg by mouth at bedtime as needed for muscle spasms.   Yes Historical Provider, MD  traMADol (ULTRAM) 50 MG tablet Take 50 mg by  mouth every 12 (twelve) hours as needed for moderate pain.   Yes Historical Provider, MD  zolpidem (AMBIEN) 5 MG tablet Take 2.5 mg by mouth at bedtime as needed for sleep.   Yes Historical Provider, MD   Physical Exam: Filed Vitals:   06/13/14 1730  BP: 220/95  Pulse: 71  Temp: 98 F (36.7 C)  Resp: 18    BP 220/95  Pulse 71  Temp(Src) 98 F (36.7 C) (Oral)  Resp 18  Ht 5\' 8"  (1.727 m)  Wt 113.4 kg (250 lb)  BMI 38.02 kg/m2  SpO2 99%  General Appearance:    Alert, oriented, no distress, appears stated age  Head:    Normocephalic, atraumatic  Eyes:    PERRL, EOMI, sclera non-icteric        Nose:   Nares without drainage or  epistaxis. Mucosa, turbinates normal  Throat:   Moist mucous membranes. Oropharynx without erythema or exudate.  Neck:   Supple. No carotid bruits.  No thyromegaly.  No lymphadenopathy.   Back:     No CVA tenderness, no spinal tenderness  Lungs:     Clear to auscultation bilaterally, without wheezes, rhonchi or rales  Chest wall:    No tenderness to palpitation  Heart:    Regular rate and rhythm without murmurs, gallops, rubs  Abdomen:     Soft, non-tender, nondistended, normal bowel sounds, no organomegaly  Genitalia:    deferred  Rectal:    deferred  Extremities:   No clubbing, cyanosis or edema.  Pulses:   2+ and symmetric all extremities  Skin:   Skin color, texture, turgor normal, no rashes or lesions  Lymph nodes:   Cervical, supraclavicular, and axillary nodes normal  Neurologic:   CNII-XII intact. Normal strength, sensation and reflexes      throughout    Labs on Admission:  Basic Metabolic Panel:  Recent Labs Lab 06/13/14 1004  NA 139  K 3.3*  CL 95*  CO2 31  GLUCOSE 71  BUN 29*  CREATININE 7.25*  CALCIUM 7.5*   Liver Function Tests: No results found for this basename: AST, ALT, ALKPHOS, BILITOT, PROT, ALBUMIN,  in the last 168 hours No results found for this basename: LIPASE, AMYLASE,  in the last 168 hours No results found for this basename: AMMONIA,  in the last 168 hours CBC:  Recent Labs Lab 06/13/14 1004  WBC 8.1  HGB 8.7*  HCT 26.8*  MCV 102.3*  PLT 317   Cardiac Enzymes: No results found for this basename: CKTOTAL, CKMB, CKMBINDEX, TROPONINI,  in the last 168 hours  BNP (last 3 results) No results found for this basename: PROBNP,  in the last 8760 hours CBG:  Recent Labs Lab 06/13/14 0934 06/13/14 1048 06/13/14 1122 06/13/14 1250 06/13/14 1924  GLUCAP 71 65* 99 74 130*    Radiological Exams on Admission: Ir Fluoro Guide Cv Line Right  06/13/2014   CLINICAL DATA:  ESRD needing dialysis  TECHNIQUE: RIGHT IJ CATHETER PLACEMENT UNDER  ULTRASOUND AND FLUOROSCOPIC GUIDANCE  The procedure, risks (including but not limited to bleeding, infection, organ damage, pneumothorax), benefits, and alternatives were explained to the patient. Questions regarding the procedure were encouraged and answered. The patient understands and consents to the procedure. Patency of the right IJ vein was confirmed with ultrasound with image documentation. An appropriate skin site was determined. Skin site was marked. Region was prepped using maximum barrier technique including cap and mask, sterile gown, sterile gloves, large sterile sheet, and Chlorhexidine as  cutaneous antisepsis. The region was infiltrated locally with 1% lidocaine. Under real-time ultrasound guidance, the right IJ vein was accessed with a 19 gauge needle; the needle tip within the vein was confirmed with ultrasound image documentation. The needle exchanged over a guidewire for vascular dilator which allowed advancement of a 20 cm Trialysis catheter. This was positioned with the tip at the cavoatrial junction. Spot chest radiograph shows good positioning and no pneumothorax. Catheter was flushed and sutured externally with 0-Prolene sutures. Patient tolerated the procedure well, with no immediate complication.  IMPRESSION: 1. Technically successful right IJ Trialysis catheter placement.   Electronically Signed   By: Oley Balm M.D.   On: 06/13/2014 16:46   Ir US Guide Vasc Access Right  06/13/2014   CLINICAL DATA:  ESRD needing dialysis  TECHNIQUE: RIGHT IJ CATHETER PLACEMENT UNDER ULTRASOUND AND FLUOROSCOPIC GUIDANCE  The procedure, risks (including but not limited to bleeding, infection, organ damage, pneumothorax), benefits, and alternatives were explained to the patient. Questions regarding the procedure were encouraged and answered. The patient understands and consents to the procedure. Patency of the right IJ vein was confirmed with ultrasound with image documentation. An appropriate skin  site was determined. Skin site was marked. Region was prepped using maximum barrier technique including cap and mask, sterile gown, sterile gloves, large sterile sheet, and Chlorhexidine as cutaneous antisepsis. The region was infiltrated locally with 1% lidocaine. Under real-time ultrasound guidance, the right IJ vein was accessed with a 19 gauge needle; the needle tip within the vein was confirmed with ultrasound image documentation. The needle exchanged over a guidewire for vascular dilator which allowed advancement of a 20 cm Trialysis catheter. This was positioned with the tip at the cavoatrial junction. Spot chest radiograph shows good positioning and no pneumothorax. Catheter was flushed and sutured externally with 0-Prolene sutures. Patient tolerated the procedure well, with no immediate complication.  IMPRESSION: 1. Technically successful right IJ Trialysis catheter placement.   Electronically Signed   By: Oley Balm M.D.   On: 06/13/2014 16:46    EKG: Independently reviewed.  Assessment/Plan Principal Problem:   Problem with dialysis access Active Problems:   ESRD needing dialysis   DM2 (diabetes mellitus, type 2)   HTN (hypertension)   1. ESRD - nephrology called me to ask for admission until patient can get tunneled catheter placed, need to call vascular in AM to see about getting this placed.  Patient currently off of his plavix. 2. DM2 - continue home glipizide, switching his once daily novolog to a low dose SSI ac/hs. 3. HTN - continue home meds, holding the PO potassium he takes at home for now, repeat CBC and BMP are ordered for the morning.    Code Status: Full Code  Family Communication: Spoke with son on phone Disposition Plan: Admit to obs   Time spent: 70 min  GARDNER, JARED M. [facility] Pager [phone]  If 7AM-7PM, please contact the day team taking care of the patient [email or web address] Password [facility] 06/13/2014, 8:48 PM

## 2014-06-13 NOTE — Progress Notes (Signed)
Hypoglycemic Event  CBG: 65   Treatment: D50 IV 25 mL  Symptoms: None  Follow-up CBG: Time:1122 CBG Result:99  Possible Reasons for Event: Inadequate meal intake  Comments/MD notified: will continue to monitor patient     [name], [name]  Remember to initiate Hypoglycemia Order Set & complete

## 2014-06-13 NOTE — Procedures (Signed)
R IJ Trialysis cathter placement with US and fluoroscopy No complication No blood loss. See complete dictation in [facility] P[facility].  

## 2014-06-13 NOTE — Procedures (Signed)
Patient was seen on dialysis and the procedure was supervised.   BFR 250 Via right IJ temp cath   UF 3 liters Bath 2K 2.5Ca  He is tolerating treatment without any complications.  He is not tachpneic with no accessory muscle use.  He will be going home afterwards with a temporary catheter. No evidence of bleeding from the right IJ catheter.  Patient appears to be tolerating treatment well.  Noah FloorJames Derrin Currey, MD 06/13/2014, 4:24 PM

## 2014-06-14 ENCOUNTER — Encounter (HOSPITAL_COMMUNITY): Payer: Self-pay

## 2014-06-14 DIAGNOSIS — E1169 Type 2 diabetes mellitus with other specified complication: Secondary | ICD-10-CM | POA: Diagnosis present

## 2014-06-14 DIAGNOSIS — I120 Hypertensive chronic kidney disease with stage 5 chronic kidney disease or end stage renal disease: Secondary | ICD-10-CM | POA: Diagnosis present

## 2014-06-14 DIAGNOSIS — E876 Hypokalemia: Secondary | ICD-10-CM | POA: Diagnosis present

## 2014-06-14 DIAGNOSIS — Z87891 Personal history of nicotine dependence: Secondary | ICD-10-CM | POA: Diagnosis not present

## 2014-06-14 DIAGNOSIS — Z992 Dependence on renal dialysis: Secondary | ICD-10-CM | POA: Diagnosis not present

## 2014-06-14 DIAGNOSIS — D539 Nutritional anemia, unspecified: Secondary | ICD-10-CM | POA: Diagnosis present

## 2014-06-14 DIAGNOSIS — I509 Heart failure, unspecified: Secondary | ICD-10-CM | POA: Diagnosis present

## 2014-06-14 DIAGNOSIS — N186 End stage renal disease: Secondary | ICD-10-CM | POA: Diagnosis present

## 2014-06-14 DIAGNOSIS — Z7982 Long term (current) use of aspirin: Secondary | ICD-10-CM | POA: Diagnosis not present

## 2014-06-14 DIAGNOSIS — Z8673 Personal history of transient ischemic attack (TIA), and cerebral infarction without residual deficits: Secondary | ICD-10-CM | POA: Diagnosis not present

## 2014-06-14 DIAGNOSIS — I739 Peripheral vascular disease, unspecified: Secondary | ICD-10-CM | POA: Diagnosis present

## 2014-06-14 LAB — GLUCOSE, CAPILLARY
GLUCOSE-CAPILLARY: 80 mg/dL (ref 70–99)
GLUCOSE-CAPILLARY: 102 mg/dL — AB (ref 70–99)
GLUCOSE-CAPILLARY: 126 mg/dL — AB (ref 70–99)
GLUCOSE-CAPILLARY: 155 mg/dL — AB (ref 70–99)

## 2014-06-14 LAB — BASIC METABOLIC PANEL
Anion gap: 12 (ref 5–15)
BUN: 26 mg/dL — AB (ref 6–23)
CO2: 30 mEq/L (ref 19–32)
CREATININE: 6.45 mg/dL — AB (ref 0.50–1.35)
Calcium: 7.1 mg/dL — ABNORMAL LOW (ref 8.4–10.5)
Chloride: 96 mEq/L (ref 96–112)
GFR calc non Af Amer: 7 mL/min — ABNORMAL LOW (ref >90)
GFR, EST AFRICAN AMERICAN: 9 mL/min — AB (ref >90)
GLUCOSE: 115 mg/dL — AB (ref 70–99)
POTASSIUM: 3.5 meq/L — AB (ref 3.7–5.3)
Sodium: 138 mEq/L (ref 137–147)

## 2014-06-14 LAB — CBC
HEMATOCRIT: 22 % — AB (ref 39.0–52.0)
HEMOGLOBIN: 7.1 g/dL — AB (ref 13.0–17.0)
MCH: 33 pg (ref 26.0–34.0)
MCHC: 32.3 g/dL (ref 30.0–36.0)
MCV: 102.3 fL — AB (ref 78.0–100.0)
Platelets: 260 10*3/uL (ref 150–400)
RBC: 2.15 MIL/uL — ABNORMAL LOW (ref 4.22–5.81)
RDW: 14.5 % (ref 11.5–15.5)
WBC: 8.2 10*3/uL (ref 4.0–10.5)

## 2014-06-14 LAB — ABO/RH: ABO/RH(D): A POS

## 2014-06-14 LAB — HEPATITIS B CORE ANTIBODY, TOTAL: Hep B Core Total Ab: NONREACTIVE

## 2014-06-14 LAB — HEPATITIS B SURFACE ANTIGEN: Hepatitis B Surface Ag: NEGATIVE

## 2014-06-14 LAB — HEPATITIS B SURFACE ANTIBODY,QUALITATIVE: Hep B S Ab: POSITIVE — AB

## 2014-06-14 LAB — PREPARE RBC (CROSSMATCH)

## 2014-06-14 LAB — PHOSPHORUS: Phosphorus: 4.5 mg/dL (ref 2.3–4.6)

## 2014-06-14 MED ORDER — HYDRALAZINE HCL 20 MG/ML IJ SOLN
2.0000 mg | Freq: Four times a day (QID) | INTRAMUSCULAR | Status: DC | PRN
  Administered 2014-06-15: 2 mg via INTRAVENOUS
  Filled 2014-06-14: qty 1

## 2014-06-14 MED ORDER — HYDRALAZINE HCL 20 MG/ML IJ SOLN
5.0000 mg | Freq: Once | INTRAMUSCULAR | Status: AC
  Administered 2014-06-14: 5 mg via INTRAVENOUS
  Filled 2014-06-14: qty 1

## 2014-06-14 MED ORDER — SODIUM CHLORIDE 0.9 % IJ SOLN
10.0000 mL | INTRAMUSCULAR | Status: DC | PRN
  Administered 2014-06-14 (×2): 10 mL

## 2014-06-14 MED ORDER — AMLODIPINE BESYLATE 10 MG PO TABS
10.0000 mg | ORAL_TABLET | Freq: Every day | ORAL | Status: DC
  Administered 2014-06-15 – 2014-06-16 (×2): 10 mg via ORAL
  Filled 2014-06-14 (×4): qty 1

## 2014-06-14 MED ORDER — DARBEPOETIN ALFA-POLYSORBATE 60 MCG/0.3ML IJ SOLN
60.0000 ug | INTRAMUSCULAR | Status: DC
  Administered 2014-06-16: 60 ug via INTRAVENOUS
  Filled 2014-06-14: qty 0.3

## 2014-06-14 NOTE — Consult Note (Signed)
Reason for Consult:ESRD new HD start from PD Referring Physician: Dr. Alcario Drought  Chief Complaint: ESRD, dyspnea  Assessment: 1. ESRD - new start HD after failed PD. 2. 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 3. DM 4. HTN 5. PAD 6. H/O CVA  Plan 1.   Will dialyze again today for UF; likely will need daily HD for UF to obtain dry weight.  2.   Almost certainly will not reach dry weight until next week. 3.   Since he's in the hospital we should try to have general surgery remove the right sided PD cath; he is considered to be an ultrafiltration failure in PD and not a candidate for PD in the future. 4.   Will check phos, PTH to manage renal osteodystrophy. 5.   Will also check iron studies for anemia management. 6.   Avoid aggressive control of hypertension in order to allow challenging and determining of his dry weight.   HPI: [name] is an 80 y.o. male is a very pleasant man who is followed by Dr. Carson Myrtle and had been receiving PD but is now considered not to be a PD candidate because of ultrafiltration failure. He is being admitted to initiate HD and had a temporary catheter placed in the right IJ by VIR on 06/13/2014. He received his first HD yesterday with decreased flows given risk of dialysis dysequilibrium. He is noted to be markedly fluid overloaded failing multiple treatment changes by Dr. Carson Myrtle to no avail and finally admitted to help manage volume as well. He denies any chest pain, fever, chills, nausea or anorexia. He was short of breath yesterday but after a single treatment felt a little better.   ROS Pertinent items are noted in HPI.  Chemistry and CBC: Creatinine, Ser  Date/Time Value Ref Range Status  06/14/2014  5:00 AM 6.45* 0.50 - 1.35 mg/dL Final  06/13/2014 10:04 AM 7.25* 0.50 - 1.35 mg/dL Final  03/09/2011  1:47 PM 4.73* 0.4 - 1.5 mg/dL Final    Recent Labs Lab 06/13/14 1004 06/14/14 0500  NA 139 138  K 3.3*  3.5*  CL 95* 96  CO2 31 30  GLUCOSE 71 115*  BUN 29* 26*  CREATININE 7.25* 6.45*  CALCIUM 7.5* 7.1*    Recent Labs Lab 06/13/14 1004 06/14/14 0500  WBC 8.1 8.2  HGB 8.7* 7.1*  HCT 26.8* 22.0*  MCV 102.3* 102.3*  PLT 317 260   Liver Function Tests: No results found for this basename: AST, ALT, ALKPHOS, BILITOT, PROT, ALBUMIN,  in the last 168 hours No results found for this basename: LIPASE, AMYLASE,  in the last 168 hours No results found for this basename: AMMONIA,  in the last 168 hours Cardiac Enzymes: No results found for this basename: CKTOTAL, CKMB, CKMBINDEX, TROPONINI,  in the last 168 hours Iron Studies: No results found for this basename: IRON, TIBC, TRANSFERRIN, FERRITIN,  in the last 72 hours PT/INR: @LABRCNTIP (inr:5)  Xrays/Other Studies: ) Results for orders placed during the hospital encounter of 06/13/14 (from the past 48 hour(s))  GLUCOSE, CAPILLARY     Status: None   Collection Time    06/13/14  9:34 AM      Result Value Ref Range   Glucose-Capillary 71  70 - 99 mg/dL  APTT     Status: None   Collection Time    06/13/14 10:04 AM      Result Value Ref Range   aPTT 33  24 - 37  seconds  BASIC METABOLIC PANEL     Status: Abnormal   Collection Time    06/13/14 10:04 AM      Result Value Ref Range   Sodium 139  137 - 147 mEq/L   Potassium 3.3 (*) 3.7 - 5.3 mEq/L   Chloride 95 (*) 96 - 112 mEq/L   CO2 31  19 - 32 mEq/L   Glucose, Bld 71  70 - 99 mg/dL   BUN 29 (*) 6 - 23 mg/dL   Creatinine, Ser 7.25 (*) 0.50 - 1.35 mg/dL   Calcium 7.5 (*) 8.4 - 10.5 mg/dL   GFR calc non Af Amer 6 (*) >90 mL/min   GFR calc Af Amer 7 (*) >90 mL/min   Comment: (NOTE)     The eGFR has been calculated using the CKD EPI equation.     This calculation has not been validated in all clinical situations.     eGFR's persistently <90 mL/min signify possible Chronic Kidney     Disease.   Anion gap 13  5 - 15  CBC     Status: Abnormal   Collection Time    06/13/14 10:04 AM       Result Value Ref Range   WBC 8.1  4.0 - 10.5 K/uL   RBC 2.62 (*) 4.22 - 5.81 MIL/uL   Hemoglobin 8.7 (*) 13.0 - 17.0 g/dL   HCT 26.8 (*) 39.0 - 52.0 %   MCV 102.3 (*) 78.0 - 100.0 fL   MCH 33.2  26.0 - 34.0 pg   MCHC 32.5  30.0 - 36.0 g/dL   RDW 14.2  11.5 - 15.5 %   Platelets 317  150 - 400 K/uL  PROTIME-INR     Status: None   Collection Time    06/13/14 10:04 AM      Result Value Ref Range   Prothrombin Time 14.0  11.6 - 15.2 seconds   INR 1.08  0.00 - 1.49  GLUCOSE, CAPILLARY     Status: Abnormal   Collection Time    06/13/14 10:48 AM      Result Value Ref Range   Glucose-Capillary 65 (*) 70 - 99 mg/dL  GLUCOSE, CAPILLARY     Status: None   Collection Time    06/13/14 11:22 AM      Result Value Ref Range   Glucose-Capillary 99  70 - 99 mg/dL  GLUCOSE, CAPILLARY     Status: None   Collection Time    06/13/14 12:50 PM      Result Value Ref Range   Glucose-Capillary 74  70 - 99 mg/dL  HEPATITIS B SURFACE ANTIGEN     Status: None   Collection Time    06/13/14  3:02 PM      Result Value Ref Range   Hepatitis B Surface Ag NEGATIVE  NEGATIVE   Comment: Performed at [place], TOTAL     Status: None   Collection Time    06/13/14  3:02 PM      Result Value Ref Range   Hep B Core Total Ab NON REACTIVE  NON REACTIVE   Comment: Performed at [place] ANTIBODY     Status: Abnormal   Collection Time    06/13/14  3:02 PM      Result Value Ref Range   Hep B S Ab POSITIVE (*) NEGATIVE   Comment: Performed at [facility]  Status: Abnormal   Collection Time    06/13/14  7:24 PM      Result Value Ref Range   Glucose-Capillary 130 (*) 70 - 99 mg/dL  GLUCOSE, CAPILLARY     Status: Abnormal   Collection Time    06/13/14  8:51 PM      Result Value Ref Range   Glucose-Capillary 181 (*) 70 - 99 mg/dL  CBC     Status: Abnormal   Collection Time    06/14/14  5:00 AM       Result Value Ref Range   WBC 8.2  4.0 - 10.5 K/uL   RBC 2.15 (*) 4.22 - 5.81 MIL/uL   Hemoglobin 7.1 (*) 13.0 - 17.0 g/dL   Comment: REPEATED TO VERIFY     DELTA CHECK NOTED   HCT 22.0 (*) 39.0 - 52.0 %   MCV 102.3 (*) 78.0 - 100.0 fL   MCH 33.0  26.0 - 34.0 pg   MCHC 32.3  30.0 - 36.0 g/dL   RDW 14.5  11.5 - 15.5 %   Platelets 260  150 - 400 K/uL  BASIC METABOLIC PANEL     Status: Abnormal   Collection Time    06/14/14  5:00 AM      Result Value Ref Range   Sodium 138  137 - 147 mEq/L   Potassium 3.5 (*) 3.7 - 5.3 mEq/L   Chloride 96  96 - 112 mEq/L   CO2 30  19 - 32 mEq/L   Glucose, Bld 115 (*) 70 - 99 mg/dL   BUN 26 (*) 6 - 23 mg/dL   Creatinine, Ser 6.45 (*) 0.50 - 1.35 mg/dL   Calcium 7.1 (*) 8.4 - 10.5 mg/dL   GFR calc non Af Amer 7 (*) >90 mL/min   GFR calc Af Amer 9 (*) >90 mL/min   Comment: (NOTE)     The eGFR has been calculated using the CKD EPI equation.     This calculation has not been validated in all clinical situations.     eGFR's persistently <90 mL/min signify possible Chronic Kidney     Disease.   Anion gap 12  5 - 15  GLUCOSE, CAPILLARY     Status: None   Collection Time    06/14/14  7:53 AM      Result Value Ref Range   Glucose-Capillary 80  70 - 99 mg/dL   Ir Fluoro Guide Cv Line Right  06/13/2014   CLINICAL DATA:  ESRD needing dialysis  TECHNIQUE: RIGHT IJ CATHETER PLACEMENT UNDER ULTRASOUND AND FLUOROSCOPIC GUIDANCE  The procedure, risks (including but not limited to bleeding, infection, organ damage, pneumothorax), benefits, and alternatives were explained to the patient. Questions regarding the procedure were encouraged and answered. The patient understands and consents to the procedure. Patency of the right IJ vein was confirmed with ultrasound with image documentation. An appropriate skin site was determined. Skin site was marked. Region was prepped using maximum barrier technique including cap and mask, sterile gown, sterile gloves, large sterile  sheet, and Chlorhexidine as cutaneous antisepsis. The region was infiltrated locally with 1% lidocaine. Under real-time ultrasound guidance, the right IJ vein was accessed with a 19 gauge needle; the needle tip within the vein was confirmed with ultrasound image documentation. The needle exchanged over a guidewire for vascular dilator which allowed advancement of a 20 cm Trialysis catheter. This was positioned with the tip at the cavoatrial junction. Spot chest radiograph shows good positioning and no pneumothorax. Catheter  was flushed and sutured externally with 0-Prolene sutures. Patient tolerated the procedure well, with no immediate complication.  IMPRESSION: 1. Technically successful right IJ Trialysis catheter placement.   Electronically Signed   By: Arne Cleveland M.D.   On: 06/13/2014 16:46   Ir US Guide Vasc Access Right  06/13/2014   CLINICAL DATA:  ESRD needing dialysis  TECHNIQUE: RIGHT IJ CATHETER PLACEMENT UNDER ULTRASOUND AND FLUOROSCOPIC GUIDANCE  The procedure, risks (including but not limited to bleeding, infection, organ damage, pneumothorax), benefits, and alternatives were explained to the patient. Questions regarding the procedure were encouraged and answered. The patient understands and consents to the procedure. Patency of the right IJ vein was confirmed with ultrasound with image documentation. An appropriate skin site was determined. Skin site was marked. Region was prepped using maximum barrier technique including cap and mask, sterile gown, sterile gloves, large sterile sheet, and Chlorhexidine as cutaneous antisepsis. The region was infiltrated locally with 1% lidocaine. Under real-time ultrasound guidance, the right IJ vein was accessed with a 19 gauge needle; the needle tip within the vein was confirmed with ultrasound image documentation. The needle exchanged over a guidewire for vascular dilator which allowed advancement of a 20 cm Trialysis catheter. This was positioned with the  tip at the cavoatrial junction. Spot chest radiograph shows good positioning and no pneumothorax. Catheter was flushed and sutured externally with 0-Prolene sutures. Patient tolerated the procedure well, with no immediate complication.  IMPRESSION: 1. Technically successful right IJ Trialysis catheter placement.   Electronically Signed   By: Arne Cleveland M.D.   On: 06/13/2014 16:46    PMH:   Past Medical History  Diagnosis Date  . Hypertension   . Stroke   . CHF (congestive heart failure)   . Peripheral vascular disease   . Diabetes mellitus without complication   . Renal disease     PSH:  History reviewed. No pertinent past surgical history.  Allergies:  Allergies  Allergen Reactions  . Hydrocodone     unknown  . Penicillins     unknown    Medications:   Prior to Admission medications   Medication Sig Start Date End Date Taking? Authorizing Provider  allopurinol (ZYLOPRIM) 100 MG tablet Take 100 mg by mouth daily.   Yes Historical Provider, MD  aspirin EC 81 MG tablet Take 81 mg by mouth daily.   Yes Historical Provider, MD  calcitRIOL (ROCALTROL) 0.5 MCG capsule Take 0.5 mcg by mouth daily.   Yes Historical Provider, MD  carvedilol (COREG) 25 MG tablet Take 25 mg by mouth 2 (two) times daily with a meal.   Yes Historical Provider, MD  clopidogrel (PLAVIX) 75 MG tablet Take 75 mg by mouth daily.   Yes Historical Provider, MD  folic acid-vitamin b complex-vitamin c-selenium-zinc (DIALYVITE) 3 MG TABS tablet Take 1 tablet by mouth daily.   Yes Historical Provider, MD  furosemide (LASIX) 40 MG tablet Take 120 mg by mouth daily.   Yes Historical Provider, MD  glipiZIDE (GLUCOTROL) 10 MG tablet Take 10 mg by mouth daily before breakfast.   Yes Historical Provider, MD  insulin lispro (HUMALOG) 100 UNIT/ML injection Inject 40 Units into the skin at bedtime.   Yes Historical Provider, MD  loratadine (CLARITIN) 10 MG tablet Take 10 mg by mouth daily.   Yes Historical Provider, MD   nitroGLYCERIN (NITRODUR - DOSED IN MG/24 HR) 0.2 mg/hr patch Place 0.2 mg onto the skin daily.   Yes Historical Provider, MD  omeprazole (PRILOSEC) 40 MG  capsule Take 40 mg by mouth daily.   Yes Historical Provider, MD  potassium chloride 20 MEQ/15ML (10%) solution Take 30 mEq by mouth daily.   Yes Historical Provider, MD  simvastatin (ZOCOR) 40 MG tablet Take 40 mg by mouth daily.   Yes Historical Provider, MD  sodium bicarbonate 650 MG tablet Take 1,300 mg by mouth 2 (two) times daily.   Yes Historical Provider, MD  sorbitol 70 % solution Take 30 mLs by mouth daily as needed (constipation).   Yes Historical Provider, MD  tiZANidine (ZANAFLEX) 2 MG tablet Take 2 mg by mouth at bedtime as needed for muscle spasms.   Yes Historical Provider, MD  traMADol (ULTRAM) 50 MG tablet Take 50 mg by mouth every 12 (twelve) hours as needed for moderate pain.   Yes Historical Provider, MD  zolpidem (AMBIEN) 5 MG tablet Take 2.5 mg by mouth at bedtime as needed for sleep.   Yes Historical Provider, MD    Discontinued Meds:   Medications Discontinued During This Encounter  Medication Reason  . ceFAZolin (ANCEF) IVPB 2 g/50 mL premix   . pentafluoroprop-tetrafluoroeth (GEBAUERS) aerosol 1 application Patient Transfer  . lidocaine (PF) (XYLOCAINE) 1 % injection 5 mL Patient Transfer  . lidocaine-prilocaine (EMLA) cream 1 application Patient Transfer  . 0.9 %  sodium chloride infusion Patient Transfer  . 0.9 %  sodium chloride infusion Patient Transfer  . feeding supplement (NEPRO CARB STEADY) liquid 237 mL Patient Transfer  . heparin injection 1,000 Units Patient Transfer  . alteplase (CATHFLO ACTIVASE) injection 2 mg Patient Transfer  . heparin injection 2,300 Units Patient Transfer    Social History:  has no tobacco, alcohol, and drug history on file.  Family History:  No family history on file.  Blood pressure 196/83, pulse 83, temperature 99.8 F (37.7 C), temperature source Oral, resp. rate 18,  height 5' 8"  (1.727 m), weight 111.585 kg (246 lb), SpO2 90.00%. General appearance: alert, cooperative, appears stated age and moderately obese Head: Normocephalic, without obvious abnormality, atraumatic Eyes: negative, conjunctivae/corneas clear. PERRL, EOM's intact. Fundi benign. Throat: normal findings: palate normal and tongue midline and normal Neck: no adenopathy, no carotid bruit, no JVD, supple, symmetrical, trachea midline and thyroid not enlarged, symmetric, no tenderness/mass/nodules Back: symmetric, no curvature. ROM normal. No CVA tenderness. Resp: clear to auscultation bilaterally Chest wall: no tenderness Cardio: regular rate and rhythm, S1, S2 normal, no murmur, click, rub or gallop GI: soft, non-tender; bowel sounds normal; no masses,  no organomegaly and obese Extremities: edema 2+ Pulses: 2+ and symmetric Skin: Skin color, texture, turgor normal. No rashes or lesions Neurologic: Grossly normal       Sahiba Granholm, Hunt Oris, MD 06/14/2014, 9:18 AM

## 2014-06-14 NOTE — Progress Notes (Signed)
UR completed 

## 2014-06-14 NOTE — Progress Notes (Signed)
[facility] PROGRESS NOTE  [name] ONG:[id number] DOB: Apr 23, 1934 DOA: 06/13/2014 PCP: Lisette Abu, MD Interim summary: [name] is a 80 y.o. male with ESRD, previously on peritoneal dialysis who is being switched over to HD due to PD not removing enough fluid from the patient. He came in to the hospital today initially for placement of a tunneled catheter; however, this couldn't be performed due to patient being on plavix. As a result, a non tunneled temp catheter was placed into his RIJ and HD performed via this. Nephrology has called me to ask for admission for the patient because he cant really safely leave the hospital with an RIJ non tunneled catheter in place. He was admitted to [facility] service for the above and vascular surgery consulted for tunnlled catheter placement.   Assessment/Plan: ESRD on HD: Admitted and called vascular for tunneled catheter placement. Stopping the plavix.  Diabetes mellitus: D/c oral hypoglecemics.  SSI.  Accelerated Hypertension: Not well Controlled.  Added on hydralazine.    Anemia: Macrocytic. Anemia panel ordered.  1unit of prbc transfusion ordered.  Repeat H&H in am.   Mild hypokalemia: Continue to monitor.   DVT prophylaxis.    Code Status: full code Family Communication: none at bedside Disposition Plan: pending.    Consultants:  Renal  Vascular  pt  Procedures:  none  Antibiotics:  none  HPI/Subjective:comfortable.   Objective: Filed Vitals:   06/14/14 0922  BP: 197/94  Pulse: 87  Temp: 99.5 F (37.5 C)  Resp: 20    Intake/Output Summary (Last 24 hours) at 06/14/14 1716 Last data filed at 06/14/14 1650  Gross per 24 hour  Intake    130 ml  Output   2000 ml  Net  -1870 ml   Filed Weights   06/13/14 0922 06/13/14 1429 06/13/14 2057  Weight: 113.399 kg (250 lb) 113.4 kg (250 lb) 111.585 kg (246 lb)    Exam:   General:  Alert afebrile comfortable  Cardiovascular: s1s2  Respiratory: ctab,  diminished air entry at bases.   Abdomen: obese , NT ND BS+  Musculoskeletal: pedal edema.  Data Reviewed: Basic Metabolic Panel:  Recent Labs Lab 06/13/14 1004 06/14/14 0500  NA 139 138  K 3.3* 3.5*  CL 95* 96  CO2 31 30  GLUCOSE 71 115*  BUN 29* 26*  CREATININE 7.25* 6.45*  CALCIUM 7.5* 7.1*  PHOS  --  4.5   Liver Function Tests: No results found for this basename: AST, ALT, ALKPHOS, BILITOT, PROT, ALBUMIN,  in the last 168 hours No results found for this basename: LIPASE, AMYLASE,  in the last 168 hours No results found for this basename: AMMONIA,  in the last 168 hours CBC:  Recent Labs Lab 06/13/14 1004 06/14/14 0500  WBC 8.1 8.2  HGB 8.7* 7.1*  HCT 26.8* 22.0*  MCV 102.3* 102.3*  PLT 317 260   Cardiac Enzymes: No results found for this basename: CKTOTAL, CKMB, CKMBINDEX, TROPONINI,  in the last 168 hours BNP (last 3 results) No results found for this basename: PROBNP,  in the last 8760 hours CBG:  Recent Labs Lab 06/13/14 1250 06/13/14 1924 06/13/14 2051 06/14/14 0753 06/14/14 1128  GLUCAP 74 130* 181* 80 102*    No results found for this or any previous visit (from the past 240 hour(s)).   Studies: Ir Fluoro Guide Cv Line Right  06/13/2014   CLINICAL DATA:  ESRD needing dialysis  TECHNIQUE: RIGHT IJ CATHETER PLACEMENT UNDER ULTRASOUND AND FLUOROSCOPIC GUIDANCE  The procedure, risks (  including but not limited to bleeding, infection, organ damage, pneumothorax), benefits, and alternatives were explained to the patient. Questions regarding the procedure were encouraged and answered. The patient understands and consents to the procedure. Patency of the right IJ vein was confirmed with ultrasound with image documentation. An appropriate skin site was determined. Skin site was marked. Region was prepped using maximum barrier technique including cap and mask, sterile gown, sterile gloves, large sterile sheet, and Chlorhexidine as cutaneous antisepsis. The  region was infiltrated locally with 1% lidocaine. Under real-time ultrasound guidance, the right IJ vein was accessed with a 19 gauge needle; the needle tip within the vein was confirmed with ultrasound image documentation. The needle exchanged over a guidewire for vascular dilator which allowed advancement of a 20 cm Trialysis catheter. This was positioned with the tip at the cavoatrial junction. Spot chest radiograph shows good positioning and no pneumothorax. Catheter was flushed and sutured externally with 0-Prolene sutures. Patient tolerated the procedure well, with no immediate complication.  IMPRESSION: 1. Technically successful right IJ Trialysis catheter placement.   Electronically Signed   By: Oley Balm M.D.   On: 06/13/2014 16:46   Ir US Guide Vasc Access Right  06/13/2014   CLINICAL DATA:  ESRD needing dialysis  TECHNIQUE: RIGHT IJ CATHETER PLACEMENT UNDER ULTRASOUND AND FLUOROSCOPIC GUIDANCE  The procedure, risks (including but not limited to bleeding, infection, organ damage, pneumothorax), benefits, and alternatives were explained to the patient. Questions regarding the procedure were encouraged and answered. The patient understands and consents to the procedure. Patency of the right IJ vein was confirmed with ultrasound with image documentation. An appropriate skin site was determined. Skin site was marked. Region was prepped using maximum barrier technique including cap and mask, sterile gown, sterile gloves, large sterile sheet, and Chlorhexidine as cutaneous antisepsis. The region was infiltrated locally with 1% lidocaine. Under real-time ultrasound guidance, the right IJ vein was accessed with a 19 gauge needle; the needle tip within the vein was confirmed with ultrasound image documentation. The needle exchanged over a guidewire for vascular dilator which allowed advancement of a 20 cm Trialysis catheter. This was positioned with the tip at the cavoatrial junction. Spot chest radiograph  shows good positioning and no pneumothorax. Catheter was flushed and sutured externally with 0-Prolene sutures. Patient tolerated the procedure well, with no immediate complication.  IMPRESSION: 1. Technically successful right IJ Trialysis catheter placement.   Electronically Signed   By: Oley Balm M.D.   On: 06/13/2014 16:46    Scheduled Meds: . sodium chloride   Intravenous Once  . allopurinol  100 mg Oral Daily  . aspirin EC  81 mg Oral Daily  . calcitRIOL  0.5 mcg Oral Daily  . carvedilol  25 mg Oral BID WC  . [START ON 06/16/2014] darbepoetin (ARANESP) injection - DIALYSIS  60 mcg Intravenous Q Sat-HD  . furosemide  120 mg Oral Daily  . glipiZIDE  10 mg Oral QAC breakfast  . heparin  5,000 Units Subcutaneous 3 times per day  . insulin aspart  0-9 Units Subcutaneous TID WC  . loratadine  10 mg Oral Daily  . nitroGLYCERIN  0.2 mg Transdermal Q24H  . pantoprazole  80 mg Oral Daily  . simvastatin  40 mg Oral Daily  . sodium bicarbonate  1,300 mg Oral BID   Continuous Infusions:   Principal Problem:   Problem with dialysis access Active Problems:   ESRD needing dialysis   DM2 (diabetes mellitus, type 2)   HTN (hypertension)  Time spent: 30 minutes.     Metro Surgery CenterKULA,Shilo Pauwels  [facility] Pager [phone] If 7PM-7AM, please contact night-coverage at [email or web address], password [email or web address] 06/14/2014, 5:16 PM  LOS: 1 day

## 2014-06-14 NOTE — Consult Note (Signed)
 [facility] [place]  Patient name: [name] MRN: [id number] DOB: 08/27/1934 Sex: male  REASON FOR CONSULT: Asked to change his temporary dialysis catheter for a tunneled dialysis catheter.  HPI: Yukio Juarez is a 80 y.o. male admitted on 06/13/14 with ESRD. He is on peritoneal dialysis and this has not been removing enough fluid. He is switching to hemodialysis. He has a temporary R IJ catheter. This was placed by IR. He has previously had a right forearm graft in the remote past. He was admitted with fluid overload. He is a very poor historian.   Past Medical History  Diagnosis Date  . Hypertension   . Stroke   . CHF (congestive heart failure)   . Peripheral vascular disease   . Diabetes mellitus without complication   . Renal disease    SOCIAL HISTORY: History  Substance Use Topics  . Smoking status: Not on file  . Smokeless tobacco: Not on file  . Alcohol Use: Not on file    Allergies  Allergen Reactions  . Hydrocodone     unknown  . Penicillins     unknown   REVIEW OF SYSTEMS: [X ] denotes positive finding; [  ] denotes negative finding CARDIOVASCULAR:  [ ] chest pain   [ ] chest pressure   [ ] palpitations   [ ] orthopnea   [X ] dyspnea on exertion   [ ] claudication   [ ] rest pain   [ ] DVT   [ ] phlebitis PULMONARY:   [ ] productive cough   [ ] asthma   [ ] wheezing NEUROLOGIC:   [X ] weakness  [ ] paresthesias  [ ] aphasia  [ ] amaurosis  [ ] dizziness HEMATOLOGIC:   [ ] bleeding problems   [ ] clotting disorders MUSCULOSKELETAL:  [ ] joint pain   [ ] joint swelling [ ] leg swelling GASTROINTESTINAL: [ ]  blood in stool  [ ]  hematemesis GENITOURINARY:  [ ]  dysuria  [ ]  hematuria PSYCHIATRIC:  [ ] history of major depression INTEGUMENTARY:  [ ] rashes  [ ] ulcers CONSTITUTIONAL:  [ ] fever   [ ] chills  PHYSICAL EXAM: Filed Vitals:   06/13/14 1730 06/13/14 2057 06/14/14 0451 06/14/14 0922  BP: 220/95 184/81 196/83 197/94  Pulse:  71 77 83 87  Temp: 98 F (36.7 C) 98.6 F (37 C) 99.8 F (37.7 C) 99.5 F (37.5 C)  TempSrc: Oral Oral Oral Oral  Resp: 18 18 18 20  Height:  5' 8" (1.727 m)    Weight:  246 lb (111.585 kg)    SpO2: 99% 91% 90% 93%   Body mass index is 37.41 kg/(m^2). GENERAL: The patient is a well-nourished male, in no acute distress. The vital signs are documented above. CARDIOVASCULAR: There is a regular rate and rhythm. Moderate swelling in all extremities.  PULMONARY: There is good air exchange bilaterally without wheezing or rales. ABDOMEN: Soft and non-tender with normal pitched bowel sounds. PD catheter on the right. MUSCULOSKELETAL: There are no major deformities or cyanosis. NEUROLOGIC: No focal weakness or paresthesias are detected. SKIN: There are no ulcers or rashes noted.  DATA:  Lab Results  Component Value Date   WBC 8.2 06/14/2014   HGB 7.1* 06/14/2014   HCT 22.0* 06/14/2014   MCV 102.3* 06/14/2014   PLT 260 06/14/2014   Lab Results  Component Value Date   NA 138 06/14/2014   K   3.5* 06/14/2014   CL 96 06/14/2014   CO2 30 06/14/2014   Lab Results  Component Value Date   CREATININE 6.45* 06/14/2014   Lab Results  Component Value Date   INR 1.08 06/13/2014   INR 1.00 03/09/2011   No results found for this basename: HGBA1C   CBG (last 3)   Recent Labs  06/13/14 2051 06/14/14 0753 06/14/14 1128  GLUCAP 181* 80 102*   MEDICAL ISSUES: Will change his temporary catheter to a Diatek tomorrow AM. Please do not dialyze in AM.  Hgb = 7.1. Will leave decision on transfusion to the primary service. I have discussed the procedure and risks with the patient. All of his questions were answered. He is agreeable to proceed tomorrow.   Patryck Kilgore S [facility] [place] Beeper: [phone]   

## 2014-06-14 NOTE — Progress Notes (Signed)
New Admission Note:  Arrival Method: via stretcher from hemodialysis unit Mental Orientation: alert and orientedx4 Telemetry: N/A Assessment: Completed Skin: dry and intact Pain: 6 /10, see MAR Safety Measures: Safety Fall Prevention Plan was given, discussed and signed. Admission: Completed [facility] Orientation: Patient has been orientated to the room, unit and the staff. Family: none at bedside  Orders have been reviewed and implemented. Will continue to monitor the patient. Call light has been placed within reach and bed alarm has been activated.   Tempie DonningMercy Robi Mitter BSN, RN  Phone Number: [phone] [facility] [place] [facility]

## 2014-06-15 ENCOUNTER — Ambulatory Visit (HOSPITAL_COMMUNITY): Payer: Medicare Other

## 2014-06-15 ENCOUNTER — Encounter (HOSPITAL_COMMUNITY): Payer: Self-pay

## 2014-06-15 ENCOUNTER — Inpatient Hospital Stay (HOSPITAL_COMMUNITY): Payer: Medicare Other | Admitting: Anesthesiology

## 2014-06-15 ENCOUNTER — Encounter (HOSPITAL_COMMUNITY)
Admission: RE | Disposition: A | Discharge status: Home / Self Care | Payer: Self-pay | Source: Ambulatory Visit | Attending: Internal Medicine

## 2014-06-15 ENCOUNTER — Inpatient Hospital Stay (HOSPITAL_COMMUNITY): Payer: Medicare Other

## 2014-06-15 ENCOUNTER — Encounter (HOSPITAL_COMMUNITY): Payer: Medicare Other | Admitting: Anesthesiology

## 2014-06-15 DIAGNOSIS — N186 End stage renal disease: Secondary | ICD-10-CM

## 2014-06-15 DIAGNOSIS — N185 Chronic kidney disease, stage 5: Secondary | ICD-10-CM

## 2014-06-15 HISTORY — PX: INSERTION OF DIALYSIS CATHETER: SHX1324

## 2014-06-15 LAB — IRON AND TIBC
IRON: 16 ug/dL — AB (ref 42–135)
Saturation Ratios: 19 % — ABNORMAL LOW (ref 20–55)
TIBC: 85 ug/dL — ABNORMAL LOW (ref 215–435)
UIBC: 69 ug/dL — ABNORMAL LOW (ref 125–400)

## 2014-06-15 LAB — GLUCOSE, CAPILLARY
GLUCOSE-CAPILLARY: 63 mg/dL — AB (ref 70–99)
GLUCOSE-CAPILLARY: 62 mg/dL — AB (ref 70–99)
GLUCOSE-CAPILLARY: 113 mg/dL — AB (ref 70–99)
Glucose-Capillary: 59 mg/dL — ABNORMAL LOW (ref 70–99)
Glucose-Capillary: 97 mg/dL (ref 70–99)
Glucose-Capillary: 97 mg/dL (ref 70–99)
Glucose-Capillary: 89 mg/dL (ref 70–99)

## 2014-06-15 LAB — CBC
HEMATOCRIT: 20.8 % — AB (ref 39.0–52.0)
Hemoglobin: 6.7 g/dL — CL (ref 13.0–17.0)
MCH: 32.8 pg (ref 26.0–34.0)
MCHC: 32.2 g/dL (ref 30.0–36.0)
MCV: 102 fL — ABNORMAL HIGH (ref 78.0–100.0)
Platelets: 259 10*3/uL (ref 150–400)
RBC: 2.04 MIL/uL — ABNORMAL LOW (ref 4.22–5.81)
RDW: 14.5 % (ref 11.5–15.5)
WBC: 8.4 10*3/uL (ref 4.0–10.5)

## 2014-06-15 LAB — FERRITIN: Ferritin: 1066 ng/mL — ABNORMAL HIGH (ref 22–322)

## 2014-06-15 LAB — BASIC METABOLIC PANEL
ANION GAP: 13 (ref 5–15)
BUN: 36 mg/dL — ABNORMAL HIGH (ref 6–23)
CO2: 30 meq/L (ref 19–32)
CREATININE: 7.92 mg/dL — AB (ref 0.50–1.35)
Calcium: 7.3 mg/dL — ABNORMAL LOW (ref 8.4–10.5)
Chloride: 95 mEq/L — ABNORMAL LOW (ref 96–112)
GFR calc Af Amer: 7 mL/min — ABNORMAL LOW (ref >90)
GFR calc non Af Amer: 6 mL/min — ABNORMAL LOW (ref >90)
Glucose, Bld: 55 mg/dL — ABNORMAL LOW (ref 70–99)
Potassium: 3.3 mEq/L — ABNORMAL LOW (ref 3.7–5.3)
Sodium: 138 mEq/L (ref 137–147)

## 2014-06-15 LAB — PARATHYROID HORMONE, INTACT (NO CA): PTH: 436.8 pg/mL — AB (ref 14.0–72.0)

## 2014-06-15 LAB — PREPARE RBC (CROSSMATCH)

## 2014-06-15 SURGERY — INSERTION OF DIALYSIS CATHETER
Anesthesia: General | Site: Chest | Laterality: Right

## 2014-06-15 MED ORDER — HEPARIN SODIUM (PORCINE) 1000 UNIT/ML DIALYSIS
1000.0000 [IU] | INTRAMUSCULAR | Status: DC | PRN
  Filled 2014-06-15: qty 1

## 2014-06-15 MED ORDER — PENTAFLUOROPROP-TETRAFLUOROETH EX AERO: 1.0000 "application " | INHALATION_SPRAY | CUTANEOUS | Status: DC | PRN

## 2014-06-15 MED ORDER — VANCOMYCIN HCL 10 G IV SOLR
1500.0000 mg | INTRAVENOUS | Status: AC
  Administered 2014-06-15: 1000 mg via INTRAVENOUS
  Filled 2014-06-15: qty 1500

## 2014-06-15 MED ORDER — DEXTROSE 50 % IV SOLN
25.0000 mL | Freq: Once | INTRAVENOUS | Status: AC
Start: 2014-06-15 — End: 2014-06-15
  Administered 2014-06-15: 12.5 g via INTRAVENOUS

## 2014-06-15 MED ORDER — DEXTROSE 50 % IV SOLN
INTRAVENOUS | Status: AC
  Filled 2014-06-15: qty 50

## 2014-06-15 MED ORDER — SODIUM CHLORIDE 0.9 % IV SOLN: 100.0000 mL | INTRAVENOUS | Status: DC | PRN

## 2014-06-15 MED ORDER — FENTANYL CITRATE 0.05 MG/ML IJ SOLN
INTRAMUSCULAR | Status: AC
  Filled 2014-06-15: qty 5

## 2014-06-15 MED ORDER — HEPARIN SODIUM (PORCINE) 1000 UNIT/ML DIALYSIS: 1000.0000 [IU] | INTRAMUSCULAR | Status: DC | PRN

## 2014-06-15 MED ORDER — SORBITOL 70 % SOLN
30.0000 mL | Freq: Every day | Status: DC | PRN
Start: 2014-06-15 — End: 2014-06-17
  Administered 2014-06-15: 30 mL via ORAL
  Filled 2014-06-15: qty 30

## 2014-06-15 MED ORDER — NEPRO/CARBSTEADY PO LIQD: 237.0000 mL | ORAL | Status: DC | PRN

## 2014-06-15 MED ORDER — ALTEPLASE 2 MG IJ SOLR
2.0000 mg | Freq: Once | INTRAMUSCULAR | Status: DC | PRN
  Filled 2014-06-15: qty 2

## 2014-06-15 MED ORDER — VANCOMYCIN HCL IN DEXTROSE 1-5 GM/200ML-% IV SOLN
INTRAVENOUS | Status: AC
Start: 2014-06-15 — End: 2014-06-15
  Filled 2014-06-15: qty 200

## 2014-06-15 MED ORDER — PROPOFOL 10 MG/ML IV BOLUS
INTRAVENOUS | Status: AC
  Filled 2014-06-15: qty 20

## 2014-06-15 MED ORDER — DEXTROSE 50 % IV SOLN
INTRAVENOUS | Status: AC
  Administered 2014-06-15: 25 mL
  Filled 2014-06-15: qty 50

## 2014-06-15 MED ORDER — HEPARIN SODIUM (PORCINE) 1000 UNIT/ML IJ SOLN
INTRAMUSCULAR | Status: DC | PRN
  Administered 2014-06-15: 1000 [IU]

## 2014-06-15 MED ORDER — PROPOFOL 10 MG/ML IV BOLUS
INTRAVENOUS | Status: DC | PRN
  Administered 2014-06-15: 150 mg via INTRAVENOUS

## 2014-06-15 MED ORDER — MIDAZOLAM HCL 2 MG/2ML IJ SOLN
INTRAMUSCULAR | Status: AC
  Filled 2014-06-15: qty 2

## 2014-06-15 MED ORDER — FENTANYL CITRATE 0.05 MG/ML IJ SOLN: 25.0000 ug | INTRAMUSCULAR | Status: DC | PRN

## 2014-06-15 MED ORDER — LIDOCAINE HCL (PF) 1 % IJ SOLN: 5.0000 mL | INTRAMUSCULAR | Status: DC | PRN

## 2014-06-15 MED ORDER — HEPARIN SODIUM (PORCINE) 1000 UNIT/ML IJ SOLN
INTRAMUSCULAR | Status: AC
  Filled 2014-06-15: qty 1

## 2014-06-15 MED ORDER — 0.9 % SODIUM CHLORIDE (POUR BTL) OPTIME
TOPICAL | Status: DC | PRN
  Administered 2014-06-15: 1000 mL

## 2014-06-15 MED ORDER — SUCCINYLCHOLINE CHLORIDE 20 MG/ML IJ SOLN
INTRAMUSCULAR | Status: AC
  Filled 2014-06-15: qty 1

## 2014-06-15 MED ORDER — EPHEDRINE SULFATE 50 MG/ML IJ SOLN
INTRAMUSCULAR | Status: AC
  Filled 2014-06-15: qty 1

## 2014-06-15 MED ORDER — PHENYLEPHRINE 40 MCG/ML (10ML) SYRINGE FOR IV PUSH (FOR BLOOD PRESSURE SUPPORT)
PREFILLED_SYRINGE | INTRAVENOUS | Status: AC
  Filled 2014-06-15: qty 10

## 2014-06-15 MED ORDER — HEPARIN SODIUM (PORCINE) 5000 UNIT/ML IJ SOLN
INTRAMUSCULAR | Status: DC | PRN
  Administered 2014-06-15: 09:00:00

## 2014-06-15 MED ORDER — LIDOCAINE-PRILOCAINE 2.5-2.5 % EX CREA: 1.0000 "application " | TOPICAL_CREAM | CUTANEOUS | Status: DC | PRN

## 2014-06-15 MED ORDER — HEPARIN SODIUM (PORCINE) 1000 UNIT/ML DIALYSIS
1000.0000 [IU] | INTRAMUSCULAR | Status: DC | PRN
Start: 2014-06-15 — End: 2014-06-16

## 2014-06-15 MED ORDER — ATORVASTATIN CALCIUM 20 MG PO TABS
20.0000 mg | ORAL_TABLET | Freq: Every day | ORAL | Status: DC
  Administered 2014-06-15 – 2014-06-17 (×3): 20 mg via ORAL
  Filled 2014-06-15 (×3): qty 1

## 2014-06-15 MED ORDER — LIDOCAINE-EPINEPHRINE 0.5 %-1:200000 IJ SOLN
INTRAMUSCULAR | Status: DC | PRN
  Administered 2014-06-15: 50 mL

## 2014-06-15 MED ORDER — LIDOCAINE-EPINEPHRINE 0.5 %-1:200000 IJ SOLN
INTRAMUSCULAR | Status: AC
  Filled 2014-06-15: qty 1

## 2014-06-15 MED ORDER — FENTANYL CITRATE 0.05 MG/ML IJ SOLN
INTRAMUSCULAR | Status: DC | PRN
  Administered 2014-06-15: 25 ug via INTRAVENOUS

## 2014-06-15 MED ORDER — ALTEPLASE 2 MG IJ SOLR
2.0000 mg | Freq: Once | INTRAMUSCULAR | Status: AC | PRN
  Filled 2014-06-15: qty 2

## 2014-06-15 MED ORDER — SODIUM CHLORIDE 0.9 % IV SOLN
INTRAVENOUS | Status: DC | PRN
  Administered 2014-06-15: 10:00:00 via INTRAVENOUS

## 2014-06-15 MED ORDER — ONDANSETRON HCL 4 MG/2ML IJ SOLN: 4.0000 mg | Freq: Once | INTRAMUSCULAR | Status: DC | PRN

## 2014-06-15 SURGICAL SUPPLY — 46 items
BAG BANDED W/RUBBER/TAPE 36X54 (MISCELLANEOUS) ×6 IMPLANT
BAG DECANTER FOR FLEXI CONT (MISCELLANEOUS) ×3 IMPLANT
BENZOIN TINCTURE PRP APPL 2/3 (GAUZE/BANDAGES/DRESSINGS) ×3 IMPLANT
BLADE 10 SAFETY STRL DISP (BLADE) IMPLANT
CATH CANNON HEMO 15F 50CM (CATHETERS) IMPLANT
CATH CANNON HEMO 15FR 19 (HEMODIALYSIS SUPPLIES) IMPLANT
CATH CANNON HEMO 15FR 23CM (HEMODIALYSIS SUPPLIES) IMPLANT
CATH CANNON HEMO 15FR 31CM (HEMODIALYSIS SUPPLIES) IMPLANT
CATH CANNON HEMO 15FR 32CM (HEMODIALYSIS SUPPLIES) ×3 IMPLANT
CLOSURE WOUND 1/2 X4 (GAUZE/BANDAGES/DRESSINGS) ×1
COVER DOME SNAP 22 D (MISCELLANEOUS) ×3 IMPLANT
COVER PROBE W GEL 5X96 (DRAPES) ×3 IMPLANT
COVER SURGICAL LIGHT HANDLE (MISCELLANEOUS) ×3 IMPLANT
DECANTER SPIKE VIAL GLASS SM (MISCELLANEOUS) ×3 IMPLANT
DRAPE C-ARM 42X72 X-RAY (DRAPES) IMPLANT
DRAPE CHEST BREAST 15X10 FENES (DRAPES) ×3 IMPLANT
GAUZE SPONGE 2X2 8PLY STRL LF (GAUZE/BANDAGES/DRESSINGS) ×2 IMPLANT
GAUZE SPONGE 4X4 16PLY XRAY LF (GAUZE/BANDAGES/DRESSINGS) ×3 IMPLANT
GLOVE SS BIOGEL STRL SZ 7.5 (GLOVE) ×1 IMPLANT
GLOVE SUPERSENSE BIOGEL SZ 7.5 (GLOVE) ×2
GLOVE SURG SS PI 7.0 STRL IVOR (GLOVE) ×6 IMPLANT
GOWN STRL REUS W/ TWL LRG LVL3 (GOWN DISPOSABLE) ×1 IMPLANT
GOWN STRL REUS W/ TWL XL LVL3 (GOWN DISPOSABLE) ×1 IMPLANT
GOWN STRL REUS W/TWL LRG LVL3 (GOWN DISPOSABLE) ×2
GOWN STRL REUS W/TWL XL LVL3 (GOWN DISPOSABLE) ×2
KIT BASIN OR (CUSTOM PROCEDURE TRAY) ×3 IMPLANT
KIT ROOM TURNOVER OR (KITS) ×3 IMPLANT
NEEDLE 18GX1X1/2 (RX/OR ONLY) (NEEDLE) ×3 IMPLANT
NEEDLE 22X1 1/2 (OR ONLY) (NEEDLE) ×3 IMPLANT
NEEDLE HYPO 25GX1X1/2 BEV (NEEDLE) ×3 IMPLANT
NS IRRIG 1000ML POUR BTL (IV SOLUTION) ×3 IMPLANT
PACK SURGICAL SETUP 50X90 (CUSTOM PROCEDURE TRAY) ×3 IMPLANT
PAD ARMBOARD 7.5X6 YLW CONV (MISCELLANEOUS) ×6 IMPLANT
SOAP 2 % CHG 4 OZ (WOUND CARE) ×3 IMPLANT
SPONGE GAUZE 2X2 STER 10/PKG (GAUZE/BANDAGES/DRESSINGS) ×4
STRIP CLOSURE SKIN 1/2X4 (GAUZE/BANDAGES/DRESSINGS) ×2 IMPLANT
SUT ETHILON 3 0 PS 1 (SUTURE) ×3 IMPLANT
SUT VICRYL 4-0 PS2 18IN ABS (SUTURE) ×3 IMPLANT
SYR 20CC LL (SYRINGE) ×3 IMPLANT
SYR 5ML LL (SYRINGE) ×6 IMPLANT
SYR CONTROL 10ML LL (SYRINGE) ×3 IMPLANT
SYRINGE 10CC LL (SYRINGE) ×3 IMPLANT
TAPE CLOTH SURG 4X10 WHT LF (GAUZE/BANDAGES/DRESSINGS) ×6 IMPLANT
TOWEL OR 17X24 6PK STRL BLUE (TOWEL DISPOSABLE) ×3 IMPLANT
TOWEL OR 17X26 10 PK STRL BLUE (TOWEL DISPOSABLE) ×3 IMPLANT
WATER STERILE IRR 1000ML POUR (IV SOLUTION) ×3 IMPLANT

## 2014-06-15 NOTE — Consult Note (Signed)
[name] 03-Apr-1934  [id number].   Requesting MD: Dr. Otelia Santee Chief Complaint/Reason for Consult: Removal of peritoneal dialysis catheter HPI: This is a 80 year old black male who has multiple other medical problems including end-stage renal disease. He has been on peritoneal dialysis. He currently has had significant fluid overload and was determined to have a ultrafiltration failure. He was admitted for management of fluid overload. He has had hemodialysis access placed. We have been asked to evaluate the patient for possible removal of peritoneal dialysis catheter as he is no longer a candidate for this type of dialysis.  ROS please see history of present illness otherwise negative  History reviewed. No pertinent family history.  Past Medical History  Diagnosis Date  . Hypertension   . Stroke   . CHF (congestive heart failure)   . Peripheral vascular disease   . Renal disease   . High cholesterol   . GERD (gastroesophageal reflux disease)   . Type II diabetes mellitus   . Arthritis     "all over"  . Chronic lower back pain   . Gout     Past Surgical History  Procedure Laterality Date  . Cholecystectomy    . Arteriovenous graft placement Left 02/2011    Archie Endo 03/12/2011  . Peritoneal catheter insertion      Archie Endo 06/13/2014  . Humerus fracture surgery Right     Social History:  reports that he has quit smoking. He has never used smokeless tobacco. He reports that he does not drink alcohol or use illicit drugs.  Allergies:  Allergies  Allergen Reactions  . Hydrocodone     unknown  . Penicillins     unknown    Medications Prior to Admission  Medication Sig Dispense Refill  . allopurinol (ZYLOPRIM) 100 MG tablet Take 100 mg by mouth daily.      Marland Kitchen aspirin EC 81 MG tablet Take 81 mg by mouth daily.      . calcitRIOL (ROCALTROL) 0.5 MCG capsule Take 0.5 mcg by mouth daily.      . carvedilol (COREG) 25 MG tablet Take 25 mg by mouth 2 (two) times daily with a meal.       . clopidogrel (PLAVIX) 75 MG tablet Take 75 mg by mouth daily.      . folic acid-vitamin b complex-vitamin c-selenium-zinc (DIALYVITE) 3 MG TABS tablet Take 1 tablet by mouth daily.      . furosemide (LASIX) 40 MG tablet Take 120 mg by mouth daily.      Marland Kitchen glipiZIDE (GLUCOTROL) 10 MG tablet Take 10 mg by mouth daily before breakfast.      . insulin lispro (HUMALOG) 100 UNIT/ML injection Inject 40 Units into the skin at bedtime.      Marland Kitchen loratadine (CLARITIN) 10 MG tablet Take 10 mg by mouth daily.      . nitroGLYCERIN (NITRODUR - DOSED IN MG/24 HR) 0.2 mg/hr patch Place 0.2 mg onto the skin daily.      Marland Kitchen omeprazole (PRILOSEC) 40 MG capsule Take 40 mg by mouth daily.      . potassium chloride 20 MEQ/15ML (10%) solution Take 30 mEq by mouth daily.      . simvastatin (ZOCOR) 40 MG tablet Take 40 mg by mouth daily.      . sodium bicarbonate 650 MG tablet Take 1,300 mg by mouth 2 (two) times daily.      . sorbitol 70 % solution Take 30 mLs by mouth daily as needed (constipation).      Marland Kitchen  tiZANidine (ZANAFLEX) 2 MG tablet Take 2 mg by mouth at bedtime as needed for muscle spasms.      . traMADol (ULTRAM) 50 MG tablet Take 50 mg by mouth every 12 (twelve) hours as needed for moderate pain.      Marland Kitchen zolpidem (AMBIEN) 5 MG tablet Take 2.5 mg by mouth at bedtime as needed for sleep.        Blood pressure 152/89, pulse 68, temperature 96.1 F (35.6 C), temperature source Oral, resp. rate 16, height 5' 8"  (1.727 m), weight 249 lb (112.946 kg), SpO2 97.00%. Physical Exam: General: pleasant, obese black male who is laying in bed in NAD HEENT: head is normocephalic, atraumatic.  Sclera are noninjected.  PERRL.  Ears and nose without any masses or lesions.  Mouth is pink and moist Heart: regular, rate, and rhythm.  Normal s1,s2. No obvious murmurs, gallops, or rubs noted.  Palpable radial and pedal pulses bilaterally Lungs: CTAB, no wheezes, rhonchi, or rales noted.  Respiratory effort nonlabored  Right sided  perm cath is visible Abd: soft, NT, ND, +BS, no masses, hernias, or organomegaly, PD cath present in RMQ Psych: A&Ox3 with an appropriate affect.    Results for orders placed during the hospital encounter of 06/13/14 (from the past 48 hour(s))  GLUCOSE, CAPILLARY     Status: Abnormal   Collection Time    06/13/14  1:35 PM      Result Value Ref Range   Glucose-Capillary 59 (*) 70 - 99 mg/dL  HEPATITIS B SURFACE ANTIGEN     Status: None   Collection Time    06/13/14  3:02 PM      Result Value Ref Range   Hepatitis B Surface Ag NEGATIVE  NEGATIVE   Comment: Performed at [facility], TOTAL     Status: None   Collection Time    06/13/14  3:02 PM      Result Value Ref Range   Hep B Core Total Ab NON REACTIVE  NON REACTIVE   Comment: Performed at [facility] ANTIBODY     Status: Abnormal   Collection Time    06/13/14  3:02 PM      Result Value Ref Range   Hep B S Ab POSITIVE (*) NEGATIVE   Comment: Performed at [facility], CAPILLARY     Status: Abnormal   Collection Time    06/13/14  7:24 PM      Result Value Ref Range   Glucose-Capillary 130 (*) 70 - 99 mg/dL  GLUCOSE, CAPILLARY     Status: Abnormal   Collection Time    06/13/14  8:51 PM      Result Value Ref Range   Glucose-Capillary 181 (*) 70 - 99 mg/dL  CBC     Status: Abnormal   Collection Time    06/14/14  5:00 AM      Result Value Ref Range   WBC 8.2  4.0 - 10.5 K/uL   RBC 2.15 (*) 4.22 - 5.81 MIL/uL   Hemoglobin 7.1 (*) 13.0 - 17.0 g/dL   Comment: REPEATED TO VERIFY     DELTA CHECK NOTED   HCT 22.0 (*) 39.0 - 52.0 %   MCV 102.3 (*) 78.0 - 100.0 fL   MCH 33.0  26.0 - 34.0 pg   MCHC 32.3  30.0 - 36.0 g/dL   RDW 14.5  11.5 - 15.5 %   Platelets 260  150 - 400 K/uL  BASIC METABOLIC PANEL     Status: Abnormal   Collection Time    06/14/14  5:00 AM      Result Value Ref Range   Sodium 138  137 - 147 mEq/L   Potassium 3.5 (*) 3.7  - 5.3 mEq/L   Chloride 96  96 - 112 mEq/L   CO2 30  19 - 32 mEq/L   Glucose, Bld 115 (*) 70 - 99 mg/dL   BUN 26 (*) 6 - 23 mg/dL   Creatinine, Ser 6.45 (*) 0.50 - 1.35 mg/dL   Calcium 7.1 (*) 8.4 - 10.5 mg/dL   GFR calc non Af Amer 7 (*) >90 mL/min   GFR calc Af Amer 9 (*) >90 mL/min   Comment: (NOTE)     The eGFR has been calculated using the CKD EPI equation.     This calculation has not been validated in all clinical situations.     eGFR's persistently <90 mL/min signify possible Chronic Kidney     Disease.   Anion gap 12  5 - 15  PHOSPHORUS     Status: None   Collection Time    06/14/14  5:00 AM      Result Value Ref Range   Phosphorus 4.5  2.3 - 4.6 mg/dL  GLUCOSE, CAPILLARY     Status: None   Collection Time    06/14/14  7:53 AM      Result Value Ref Range   Glucose-Capillary 80  70 - 99 mg/dL  GLUCOSE, CAPILLARY     Status: Abnormal   Collection Time    06/14/14 11:28 AM      Result Value Ref Range   Glucose-Capillary 102 (*) 70 - 99 mg/dL  PREPARE RBC (CROSSMATCH)     Status: None   Collection Time    06/14/14  4:30 PM      Result Value Ref Range   Order Confirmation ORDER PROCESSED BY BLOOD BANK    TYPE AND SCREEN     Status: None   Collection Time    06/14/14  4:50 PM      Result Value Ref Range   ABO/RH(D) A POS     Antibody Screen NEG     Sample Expiration 06/17/2014     Unit Number [id number]     Blood Component Type RED CELLS,LR     Unit division 00     Status of Unit ISSUED     Transfusion Status OK TO TRANSFUSE     Crossmatch Result Compatible     Unit Number [id number]     Blood Component Type RED CELLS,LR     Unit division 00     Status of Unit ISSUED     Transfusion Status OK TO TRANSFUSE     Crossmatch Result Compatible    ABO/RH     Status: None   Collection Time    06/14/14  4:50 PM      Result Value Ref Range   ABO/RH(D) A POS    GLUCOSE, CAPILLARY     Status: Abnormal   Collection Time    06/14/14  5:22 PM      Result Value  Ref Range   Glucose-Capillary 126 (*) 70 - 99 mg/dL  GLUCOSE, CAPILLARY     Status: Abnormal   Collection Time    06/14/14  8:53 PM      Result Value Ref Range   Glucose-Capillary 155 (*) 70 - 99 mg/dL  FERRITIN  Status: Abnormal   Collection Time    06/15/14  4:39 AM      Result Value Ref Range   Ferritin 1066 (*) 22 - 322 ng/mL   Comment: Performed at [facility] TIBC     Status: Abnormal   Collection Time    06/15/14  4:39 AM      Result Value Ref Range   Iron 16 (*) 42 - 135 ug/dL   TIBC 85 (*) 215 - 435 ug/dL   Saturation Ratios 19 (*) 20 - 55 %   UIBC 69 (*) 125 - 400 ug/dL   Comment: Performed at [place], INTACT (NO CA)     Status: Abnormal   Collection Time    06/15/14  4:39 AM      Result Value Ref Range   PTH 436.8 (*) 14.0 - 72.0 pg/mL   Comment: Performed at [facility]     Status: Abnormal   Collection Time    06/15/14  4:39 AM      Result Value Ref Range   Sodium 138  137 - 147 mEq/L   Potassium 3.3 (*) 3.7 - 5.3 mEq/L   Chloride 95 (*) 96 - 112 mEq/L   CO2 30  19 - 32 mEq/L   Glucose, Bld 55 (*) 70 - 99 mg/dL   BUN 36 (*) 6 - 23 mg/dL   Creatinine, Ser 7.92 (*) 0.50 - 1.35 mg/dL   Calcium 7.3 (*) 8.4 - 10.5 mg/dL   GFR calc non Af Amer 6 (*) >90 mL/min   GFR calc Af Amer 7 (*) >90 mL/min   Comment: (NOTE)     The eGFR has been calculated using the CKD EPI equation.     This calculation has not been validated in all clinical situations.     eGFR's persistently <90 mL/min signify possible Chronic Kidney     Disease.   Anion gap 13  5 - 15  CBC     Status: Abnormal   Collection Time    06/15/14  4:39 AM      Result Value Ref Range   WBC 8.4  4.0 - 10.5 K/uL   RBC 2.04 (*) 4.22 - 5.81 MIL/uL   Hemoglobin 6.7 (*) 13.0 - 17.0 g/dL   Comment: REPEATED TO VERIFY     CRITICAL RESULT CALLED TO, READ BACK BY AND VERIFIED WITH:     C. MARSHALL RN [phone] 0555 GREEN R    HCT 20.8 (*) 39.0 - 52.0 %   MCV 102.0 (*) 78.0 - 100.0 fL   MCH 32.8  26.0 - 34.0 pg   MCHC 32.2  30.0 - 36.0 g/dL   RDW 14.5  11.5 - 15.5 %   Platelets 259  150 - 400 K/uL  PREPARE RBC (CROSSMATCH)     Status: None   Collection Time    06/15/14  6:47 AM      Result Value Ref Range   Order Confirmation ORDER PROCESSED BY BLOOD BANK    GLUCOSE, CAPILLARY     Status: Abnormal   Collection Time    06/15/14  8:08 AM      Result Value Ref Range   Glucose-Capillary 63 (*) 70 - 99 mg/dL  GLUCOSE, CAPILLARY     Status: None   Collection Time    06/15/14  8:44 AM      Result Value Ref Range   Glucose-Capillary 97  70 - 99 mg/dL   Comment 1 Notify RN     Comment 2 Documented in Chart    GLUCOSE, CAPILLARY     Status: Abnormal   Collection Time    06/15/14  9:32 AM      Result Value Ref Range   Glucose-Capillary 62 (*) 70 - 99 mg/dL  GLUCOSE, CAPILLARY     Status: None   Collection Time    06/15/14 10:42 AM      Result Value Ref Range   Glucose-Capillary 97  70 - 99 mg/dL  GLUCOSE, CAPILLARY     Status: None   Collection Time    06/15/14 12:02 PM      Result Value Ref Range   Glucose-Capillary 89  70 - 99 mg/dL   Ir Fluoro Guide Cv Line Right  06/13/2014   CLINICAL DATA:  ESRD needing dialysis  TECHNIQUE: RIGHT IJ CATHETER PLACEMENT UNDER ULTRASOUND AND FLUOROSCOPIC GUIDANCE  The procedure, risks (including but not limited to bleeding, infection, organ damage, pneumothorax), benefits, and alternatives were explained to the patient. Questions regarding the procedure were encouraged and answered. The patient understands and consents to the procedure. Patency of the right IJ vein was confirmed with ultrasound with image documentation. An appropriate skin site was determined. Skin site was marked. Region was prepped using maximum barrier technique including cap and mask, sterile gown, sterile gloves, large sterile sheet, and Chlorhexidine as cutaneous antisepsis. The region was infiltrated  locally with 1% lidocaine. Under real-time ultrasound guidance, the right IJ vein was accessed with a 19 gauge needle; the needle tip within the vein was confirmed with ultrasound image documentation. The needle exchanged over a guidewire for vascular dilator which allowed advancement of a 20 cm Trialysis catheter. This was positioned with the tip at the cavoatrial junction. Spot chest radiograph shows good positioning and no pneumothorax. Catheter was flushed and sutured externally with 0-Prolene sutures. Patient tolerated the procedure well, with no immediate complication.  IMPRESSION: 1. Technically successful right IJ Trialysis catheter placement.   Electronically Signed   By: Arne Cleveland M.D.   On: 06/13/2014 16:46   Ir US Guide Vasc Access Right  06/13/2014   CLINICAL DATA:  ESRD needing dialysis  TECHNIQUE: RIGHT IJ CATHETER PLACEMENT UNDER ULTRASOUND AND FLUOROSCOPIC GUIDANCE  The procedure, risks (including but not limited to bleeding, infection, organ damage, pneumothorax), benefits, and alternatives were explained to the patient. Questions regarding the procedure were encouraged and answered. The patient understands and consents to the procedure. Patency of the right IJ vein was confirmed with ultrasound with image documentation. An appropriate skin site was determined. Skin site was marked. Region was prepped using maximum barrier technique including cap and mask, sterile gown, sterile gloves, large sterile sheet, and Chlorhexidine as cutaneous antisepsis. The region was infiltrated locally with 1% lidocaine. Under real-time ultrasound guidance, the right IJ vein was accessed with a 19 gauge needle; the needle tip within the vein was confirmed with ultrasound image documentation. The needle exchanged over a guidewire for vascular dilator which allowed advancement of a 20 cm Trialysis catheter. This was positioned with the tip at the cavoatrial junction. Spot chest radiograph shows good positioning  and no pneumothorax. Catheter was flushed and sutured externally with 0-Prolene sutures. Patient tolerated the procedure well, with no immediate complication.  IMPRESSION: 1. Technically successful right IJ Trialysis catheter placement.   Electronically Signed   By: Arne Cleveland M.D.   On: 06/13/2014 16:46   Dg Chest [facility]  06/15/2014   CLINICAL DATA:  Right central line placement.  EXAM: PORTABLE CHEST - 1 VIEW  COMPARISON:  06/11/2014  FINDINGS: Right dialysis catheter is in place. The tips are in the right atrium. No pneumothorax.  Cardiomegaly with vascular congestion and interstitial prominence, likely mild interstitial edema. No visible effusions or acute bony abnormality.  IMPRESSION: Right dialysis catheter tips in the right atrium. No visible pneumothorax.  Mild interstitial edema/CHF.   Electronically Signed   By: Rolm Baptise M.D.   On: 06/15/2014 11:32       Assessment/Plan 1. End-stage renal disease 2. Hypertension 3. Diabetes mellitus 4. Congestive heart failure 5. History of CVA  Plan: 1. If the patient remains inpatient until next week, then we can tentatively plan on peritoneal dialysis catheter removal. If the patient is discharged home over the weekend he can followup with our office as an outpatient for an elective removal. Please continue to hold his Plavix if he remains inpatient until next week so that we can tentatively plan for surgical intervention. He will be PRN over the weekend.  Jeyli Zwicker E 06/15/2014, 1:19 PM Pager: [phone]

## 2014-06-15 NOTE — Anesthesia Preprocedure Evaluation (Signed)
Anesthesia Evaluation  Patient identified by MRN, date of birth, ID band Patient awake    Reviewed: Allergy & Precautions, H&P , NPO status , Patient's Chart, lab work & pertinent test results  Airway Mallampati: III  Neck ROM: full    Dental   Pulmonary former smoker,          Cardiovascular hypertension, + Peripheral Vascular Disease and +CHF     Neuro/Psych CVA    GI/Hepatic GERD- Nils Pyle EUni76vers58NormAdventist Health ClearlakDorris585Tommy RainWm. Wrigley 354/09/Hazel474-25Mickel 54Scales MHart CBarne<MEASUREMEN5563-Lowa<MEASURE53MSt Lukes Surgical At The VAntionet30te 08Debarah C62[place]3-Brandy314Yetta Nils P21yleTri County HospitaDorrWm. Wrigley J54/474-2Mickel 54NookH<MEASUREME5626Poole EndosAntionet65teDebarahNils P63y54/4Nils P23yle463NormMercy Continuing Care HospitaDorris(825Tommy RainWm. Wrigley 5Oce21a(310Tommy RicNils Pyl19ernS70NormNorton Sound Regional HospitaDorris830Tommy RainWm. Wrigley 554/09/Hazel474-259-PatrMickel 54Trophy Nils 43Pyle37NormBaptist Medical Center - PrincetoDorris205Tommy RainWm. Wrigley 654/09/HazNil36s Py44NormSt Joseph Mercy Hospital-SalinDorris5Tommy RainWm. Wrigley 654/09/Hazel474-2Nils PylePlTe21n Mi93NormHealthsouth Rehabilitation Hospital Of Fort SmitDorris425Tommy RainWm. Wrigley Jr. ZO:654/09/HazelNil54s Py13NormEndoscopy Center At St MarDorris972Tommy RainWm. Wrigley 754/09/Hazel474Mickel 54MNils Py79lert31NormFour Winds Hospital SaratogDorris334Tommy RainWm. WrigNil78s Py49NormProvidence HospitaDorris305Tommy RainWm. Wrigley 154/09/Hazel474Mickel 54MilwaHart CBNils P17ylee42NormNorthwest Eye SurgeonDorris(419)Tommy RainWm. WrigleNils Py70le5460NormSt. Joseph Medical CenteDorris541Tommy RainWm. Wrigley 54/09/HazelNils Pyl83e-MR48NormMemorial [facility] [place] LLDorris660Tommy RainWm. Wrigley 154/09/Hazel474-259-TMickeNils Pyle4MFa27llon42NormMelbourne Surgery Center LLDorris782Tommy RainWm. Wrigley 754/09/Hazel474-Mickel 54CoHart CBarne<Nils P53yleS3NormNoland Hospital AnnistoDorris505Tommy RainWm. WrigleNils Pyl25e54S46NormJohn C. Lincoln North Mountain HospitaDorris(561Tommy RainWm. Wrigley 654/09/Hazel474-25Mickel 54Double SpHart CNils Pyle74neMi19NormWestlake Ophthalmology Asc LDorris780Tommy RainWm. Wrigley 654/09/Hazel474MickNils83 Pyl65NormRiver Crest HospitaDorris7Tommy RainWm. Wrigley 454/09/Hazel474-259-Mickel 54World Nils P51ylef68NormIntegris Canadian Valley HospitaDorris6Tommy RainWm. Wrigley 554/09/Hazel474-2Mickel 54Paradise HaNils PyleC76BYou77NormMille Lacs Health SysteDorris254Tommy RainWm. Wrigley 554/09/Hazel474-259-NortMickel 54BentonvH39a5Nils Pyl47eomH48NormCommunity Health Network Rehabilitation SoutDorris(613Tommy RainWm. Wrigley 754/09/Hazel474-Nils Pyl33e-NS47NormParis Surgery Center LLDorris830Tommy RainWm. Wrigley 1054/09/Hazel474Mickel 54Garza-SalinaHart CBarne<MEASUREMEN1563-Lowa<MEASURE67MHarmony SurgeryAntionet64te 08Debarah C76[place]2262Yetta Nils P33yleLifecare Hospitals Of ShreveporDorrWm. Wrigley J54/474-2Mickel 5Nils Py50leap85NormDetroit Receiving Hospital & Univ Health CenteDorris7Tommy RainWm. Wrigley 654/Nils Pyl33eHaC43NormVa [facility] Care SysteDorris(605Tommy RainWm. Wrigley 554/09/Hazel474-MicNils Py60le 533NormBeebe Medical CenteDorris2Tommy RainWm. Wrigley 854/09/Hazel474-25Mickel 54ArtonHart CBarne OrthoNils Pyl79eicR85NormHosp Hermanos MelendeDorris6Tommy RainWm. Wrigley 554Nils Pyle/53HNew52NormCarepoint Health-Hoboken University Medical CenteDorris860Tommy RainWm. Wrigley 254/09/Hazel474Mickel 5Nil61s Py63NormW.J. Mangold Memorial HospitaDorris856Tommy RainWm. Wrigley 154/09/Hazel474-2Mickel 54CuHart CBarnes & NoblenwellrotherseASUREMENT>[facility] 24LLCoN563-Lowa<MEASURE40MMemorial H l   Peds  Hematology  (+) Blood dyscrasia, anemia ,   Anesthesia Other Findings   Reproductive/Obstetrics                           Anesthesia Physical Anesthesia Plan  ASA: III  Anesthesia Plan: General   Post-op Pain Management:    Induction: Intravenous  Airway Management Planned: LMA  Additional Equipment:   Intra-op Plan:   Post-operative Plan:   Informed Consent: I have reviewed the patients History and Physical, chart, labs and discussed the procedure including the risks, benefits and alternatives for the proposed anesthesia with the patient or authorized representative who has indicated his/her understanding and acceptance.     Plan Discussed with: CRNA, Anesthesiologist and Surgeon  Anesthesia Plan Comments:         Anesthesia Quick Evaluation

## 2014-06-15 NOTE — Interval H&P Note (Signed)
History and Physical Interval Note:  06/15/2014 10:27 AM  [name]  has presented today for surgery, with the diagnosis of End stage renal disease  The various methods of treatment have been discussed with the patient and family. After consideration of risks, benefits and other options for treatment, the patient has consented to  Procedure(s): INSERTION OF DIALYSIS CATHETER as a surgical intervention .  The patient's history has been reviewed, patient examined, no change in status, stable for surgery.  I have reviewed the patient's chart and labs.  Questions were answered to the patient's satisfaction.     Mahika Vanvoorhis

## 2014-06-15 NOTE — Consult Note (Signed)
I have seen and examined the pt and agree with PA-Osborne's note.  Plan on PD Cath removal next week by Dr. Lindie SpruceWyatt if pt still here. If pt Dc'd home over the weekend he can f/u with me for outpt catheter removal.

## 2014-06-15 NOTE — Progress Notes (Signed)
Full note to follow. We are aware of the consult requested for removal of PD catheter. Just found out patient has been taken to the OR for avascular procedure today. We were going to try to remove his PD catheter today; however, this will clearly have to wait. Further recommendations will be made in full note. We will see the patient after he returns from the operating room.  Zohaib Heeney E

## 2014-06-15 NOTE — Op Note (Signed)
    OPERATIVE REPORT  DATE OF SURGERY: 06/15/2014  PATIENT: [name], 80 y.o. male MRN: [id number]  DOB: 08-Aug-1934  PRE-OPERATIVE DIAGNOSIS: End-stage renal disease  POST-OPERATIVE DIAGNOSIS:  Same  PROCEDURE: Right IJ tunneled hemodialysis catheter  SURGEON:  Gretta Beganodd Ayansh Feutz, M.D.  PHYSICIAN ASSISTANT: Nurse  ANESTHESIA:  Gen.  EBL: Minimal ml  Total I/O In: 450 [I.V.:100; Blood:350] Out: -     DRAINS: None  SPECIMEN: None  COUNTS CORRECT:  YES  PLAN OF CARE: PACU   PATIENT DISPOSITION:  PACU - hemodynamically stable  PROCEDURE DETAILS: Patient was taken to the upper and placed supine position where the area of the right and left neck from the usual sterile fashion. The patient had existing temporary right IJ catheter. This was prepped as well. The catheter was divided and a guidewire was passed through this down to the level of the right atrium and this was confirmed with fluoroscopy. The existing catheter was removed in its entirety. A dilator and peel-away sheath was passed over the guidewire and the dilator and guidewire removed. A 27 cm hemodialysis cath was positioned to the distal right atrium. The catheter was brought to separate stab incision through the subcutaneous tunnel with the cuff in the subcutaneous tunnel. A 2 lm ports were attached in both lumens flushed and aspirated easily reluctant without unit per cc heparin. The catheter was secured to the skin with a 3-0 nylon stitch and the entry site was closed with a 4-0 subcuticular Vicryl stitch. Sterile dressing was applied and the patient was taken to the recovery room where chest x-ray is pending   Gretta Beganodd Zane Samson, M.D. 06/15/2014 10:28 AM

## 2014-06-15 NOTE — Progress Notes (Signed)
ANTIBIOTIC CONSULT NOTE - INITIAL  Pharmacy Consult for Vancomycin  Indication: Surgical Prophylaxis  Allergies  Allergen Reactions  . Hydrocodone     unknown  . Penicillins     unknown    Patient Measurements: Height: 5\' 8"  (172.7 cm) Weight: 249 lb (112.946 kg) IBW/kg (Calculated) : 68.4  Vital Signs: Temp: 98.8 F (37.1 C) (07/23 2051) Temp src: Oral (07/23 2051) BP: 184/83 mmHg (07/23 2051) Pulse Rate: 82 (07/23 2051)  Labs:  Recent Labs  06/13/14 1004 06/14/14 0500  WBC 8.1 8.2  HGB 8.7* 7.1*  PLT 317 260  CREATININE 7.25* 6.45*   Estimated Creatinine Clearance: 11.7 ml/min (by C-G formula based on Cr of 6.45).   Assessment: Vancomycin for surgical prophylaxis in HD patient undergoing Diatek placement this AM  Goal of Therapy: Prevention of infection   Plan:  -Vancomycin 1500 mg IV x 1 pre-op this AM  Hudsyn Barich 06/15/2014,1:15 AM

## 2014-06-15 NOTE — H&P (View-Only) (Signed)
[facility]  Patient name: [name] MRN: [id number] DOB: September 25, 1934 Sex: male  REASON FOR CONSULT: Asked to change his temporary dialysis catheter for a tunneled dialysis catheter.  HPI: [name] is a 80 y.o. male admitted on 06/13/14 with ESRD. He is on peritoneal dialysis and this has not been removing enough fluid. He is switching to hemodialysis. He has a temporary R IJ catheter. This was placed by IR. He has previously had a right forearm graft in the remote past. He was admitted with fluid overload. He is a very poor historian.   Past Medical History  Diagnosis Date  . Hypertension   . Stroke   . CHF (congestive heart failure)   . Peripheral vascular disease   . Diabetes mellitus without complication   . Renal disease    SOCIAL HISTORY: History  Substance Use Topics  . Smoking status: Not on file  . Smokeless tobacco: Not on file  . Alcohol Use: Not on file    Allergies  Allergen Reactions  . Hydrocodone     unknown  . Penicillins     unknown   REVIEW OF SYSTEMS: Arly.Keller[X ] denotes positive finding; [  ] denotes negative finding CARDIOVASCULAR:  [ ]  chest pain   [ ]  chest pressure   [ ]  palpitations   [ ]  orthopnea   Arly.Keller[X ] dyspnea on exertion   [ ]  claudication   [ ]  rest pain   [ ]  DVT   [ ]  phlebitis PULMONARY:   [ ]  productive cough   [ ]  asthma   [ ]  wheezing NEUROLOGIC:   Arly.Keller[X ] weakness  [ ]  paresthesias  [ ]  aphasia  [ ]  amaurosis  [ ]  dizziness HEMATOLOGIC:   [ ]  bleeding problems   [ ]  clotting disorders MUSCULOSKELETAL:  [ ]  joint pain   [ ]  joint swelling [ ]  leg swelling GASTROINTESTINAL: [ ]   blood in stool  [ ]   hematemesis GENITOURINARY:  [ ]   dysuria  [ ]   hematuria PSYCHIATRIC:  [ ]  history of major depression INTEGUMENTARY:  [ ]  rashes  [ ]  ulcers CONSTITUTIONAL:  [ ]  fever   [ ]  chills  PHYSICAL EXAM: Filed Vitals:   06/13/14 1730 06/13/14 2057 06/14/14 0451 06/14/14 0922  BP: 220/95 184/81 196/83 197/94  Pulse:  71 77 83 87  Temp: 98 F (36.7 C) 98.6 F (37 C) 99.8 F (37.7 C) 99.5 F (37.5 C)  TempSrc: Oral Oral Oral Oral  Resp: 18 18 18 20   Height:  5\' 8"  (1.727 m)    Weight:  246 lb (111.585 kg)    SpO2: 99% 91% 90% 93%   Body mass index is 37.41 kg/(m^2). GENERAL: The patient is a well-nourished male, in no acute distress. The vital signs are documented above. CARDIOVASCULAR: There is a regular rate and rhythm. Moderate swelling in all extremities.  PULMONARY: There is good air exchange bilaterally without wheezing or rales. ABDOMEN: Soft and non-tender with normal pitched bowel sounds. PD catheter on the right. MUSCULOSKELETAL: There are no major deformities or cyanosis. NEUROLOGIC: No focal weakness or paresthesias are detected. SKIN: There are no ulcers or rashes noted.  DATA:  Lab Results  Component Value Date   WBC 8.2 06/14/2014   HGB 7.1* 06/14/2014   HCT 22.0* 06/14/2014   MCV 102.3* 06/14/2014   PLT 260 06/14/2014   Lab Results  Component Value Date   NA 138 06/14/2014   K  3.5* 06/14/2014   CL 96 06/14/2014   CO2 30 06/14/2014   Lab Results  Component Value Date   CREATININE 6.45* 06/14/2014   Lab Results  Component Value Date   INR 1.08 06/13/2014   INR 1.00 03/09/2011   No results found for this basename: HGBA1C   CBG (last 3)   Recent Labs  06/13/14 2051 06/14/14 0753 06/14/14 1128  GLUCAP 181* 80 102*   MEDICAL ISSUES: Will change his temporary catheter to a Diatek tomorrow AM. Please do not dialyze in AM.  Hgb = 7.1. Will leave decision on transfusion to the primary service. I have discussed the procedure and risks with the patient. All of his questions were answered. He is agreeable to proceed tomorrow.   Renella Steig S [facility] [place]: [phone]

## 2014-06-15 NOTE — Progress Notes (Signed)
[facility] PROGRESS NOTE  [name] VWU:[id number] DOB: 11/20/1934 DOA: 06/13/2014 PCP: Lisette Abu, MD Interim summary: [name] is a 80 y.o. male with ESRD, previously on peritoneal dialysis who is being switched over to HD due to PD not removing enough fluid from the patient. He came in to the hospital today initially for placement of a tunneled catheter; however, this couldn't be performed due to patient being on plavix. As a result, a non tunneled temp catheter was placed into his RIJ and HD performed via this. Nephrology has called me to ask for admission for the patient because he cant really safely leave the hospital with an RIJ non tunneled catheter in place. He was admitted to [facility] service for the above and vascular surgery consulted for tunnlled catheter placement.   Assessment/Plan: ESRD on HD: Admitted and called vascular for tunneled catheter placement. Stopping the plavix. He underwent right IJ tunneled HD catheter placement.   Diabetes mellitus: D/c oral hypoglecemics.  SSI.  CBG (last 3)   Recent Labs  06/15/14 0932 06/15/14 1042 06/15/14 1202  GLUCAP 62* 97 89     Accelerated Hypertension: Not well Controlled.  Added on hydralazine.    Anemia: Macrocytic. Anemia panel ordered.  2unit of prbc transfusion ordered.  Repeat H&H in am.   Mild hypokalemia: Continue to monitor.   DVT prophylaxis.    Code Status: full code Family Communication: none at bedside Disposition Plan: pending.    Consultants:  Renal  Vascular  pt  Procedures:  none  Antibiotics:  none  HPI/Subjective:comfortable.   Objective: Filed Vitals:   06/15/14 1730  BP: 158/87  Pulse: 73  Temp:   Resp: 18    Intake/Output Summary (Last 24 hours) at 06/15/14 1800 Last data filed at 06/15/14 1452  Gross per 24 hour  Intake 1192.5 ml  Output    225 ml  Net  967.5 ml   Filed Weights   06/13/14 2057 06/14/14 2051 06/15/14 1626  Weight: 111.585 kg (246 lb)  112.946 kg (249 lb) 113 kg (249 lb 1.9 oz)    Exam:   General:  Alert afebrile comfortable  Cardiovascular: s1s2  Respiratory: ctab, diminished air entry at bases.   Abdomen: obese , NT ND BS+  Musculoskeletal: pedal edema.  Data Reviewed: Basic Metabolic Panel:  Recent Labs Lab 06/13/14 1004 06/14/14 0500 06/15/14 0439  NA 139 138 138  K 3.3* 3.5* 3.3*  CL 95* 96 95*  CO2 31 30 30   GLUCOSE 71 115* 55*  BUN 29* 26* 36*  CREATININE 7.25* 6.45* 7.92*  CALCIUM 7.5* 7.1* 7.3*  PHOS  --  4.5  --    Liver Function Tests: No results found for this basename: AST, ALT, ALKPHOS, BILITOT, PROT, ALBUMIN,  in the last 168 hours No results found for this basename: LIPASE, AMYLASE,  in the last 168 hours No results found for this basename: AMMONIA,  in the last 168 hours CBC:  Recent Labs Lab 06/13/14 1004 06/14/14 0500 06/15/14 0439  WBC 8.1 8.2 8.4  HGB 8.7* 7.1* 6.7*  HCT 26.8* 22.0* 20.8*  MCV 102.3* 102.3* 102.0*  PLT 317 260 259   Cardiac Enzymes: No results found for this basename: CKTOTAL, CKMB, CKMBINDEX, TROPONINI,  in the last 168 hours BNP (last 3 results) No results found for this basename: PROBNP,  in the last 8760 hours CBG:  Recent Labs Lab 06/15/14 0808 06/15/14 0844 06/15/14 0932 06/15/14 1042 06/15/14 1202  GLUCAP 63* 97 62* 97 89  No results found for this or any previous visit (from the past 240 hour(s)).   Studies: Dg Chest Port 1 View  06/15/2014   CLINICAL DATA:  Right central line placement.  EXAM: PORTABLE CHEST - 1 VIEW  COMPARISON:  06/11/2014  FINDINGS: Right dialysis catheter is in place. The tips are in the right atrium. No pneumothorax.  Cardiomegaly with vascular congestion and interstitial prominence, likely mild interstitial edema. No visible effusions or acute bony abnormality.  IMPRESSION: Right dialysis catheter tips in the right atrium. No visible pneumothorax.  Mild interstitial edema/CHF.   Electronically Signed   By:  Charlett NoseKevin  Dover M.D.   On: 06/15/2014 11:32    Scheduled Meds: . allopurinol  100 mg Oral Daily  . amLODipine  10 mg Oral Daily  . aspirin EC  81 mg Oral Daily  . atorvastatin  20 mg Oral q1800  . carvedilol  25 mg Oral BID WC  . [START ON 06/16/2014] darbepoetin (ARANESP) injection - DIALYSIS  60 mcg Intravenous Q Sat-HD  . dextrose      . furosemide  120 mg Oral Daily  . heparin  5,000 Units Subcutaneous 3 times per day  . insulin aspart  0-9 Units Subcutaneous TID WC  . loratadine  10 mg Oral Daily  . nitroGLYCERIN  0.2 mg Transdermal Q24H  . pantoprazole  80 mg Oral Daily   Continuous Infusions:   Principal Problem:   Problem with dialysis access Active Problems:   ESRD needing dialysis   DM2 (diabetes mellitus, type 2)   HTN (hypertension)    Time spent: 20 minutes.     [facility] Hospital[facility]ULA,Kimoni Pickerill  [facility] Pager [phone] If 7PM-7AM, please contact night-coverage at [email or web address], password [email or web address] 06/15/2014, 6:00 PM  LOS: 2 days

## 2014-06-15 NOTE — Progress Notes (Signed)
Pt CBG this AM 63. Pt NPO for OR to place hemodialysis permanent catheter. 25 ml D50 given. Reassessment 97. OR nurse informed of decreased CBG and interventions provided. Noted glipizide 10 mg given 0800 7/23, but has since been discontinued. Will continue to monitor. Hortencia ConradiWendi Ozelle Brubacher, RN

## 2014-06-15 NOTE — Progress Notes (Signed)
[facility] Progress Note   Assessment:  1. ESRD - new start HD after failed PD. 2. 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 3. DM 4. HTN 5. PAD 6. H/O CVA  Plan  1. Will dialyze again today for UF; likely will need daily HD for UF to obtain dry weight. Plan on HD again Sat and off Sun.  2. Almost certainly will not reach dry weight until next week.  3. 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 Agree that it's not  emergent.  4. Will check phos, PTH to manage renal osteodystrophy --> start Hectorol with each treatment and stop the rocaltrol.  5. Stop HCO3 tablets as well now that he's on dialysis. 6. Will also check iron studies for anemia management --> no IV iron with ferrtin greater than 1000. 7. Transfuse 2 units PRBC.  8. Avoid aggressive control of hypertension in order to allow challenging and determining of his dry weight. 9. If no response will d/c lasix as well.   * He does not need SW to find a dialysis unit. SW can call 48 hrs prior to speak with  Dr. Dyanne Iha [phone] Dr. Dyanne Iha the primary nephrologist can set up a dialysis chair for him (i discussed this with him on 7/23)  *   Subjective:   He feels better and denies dypsnea, nausea, diarrhea or chest pain. No events overnight. He's tolerating dialysis UF.   Objective:   BP 141/91  Pulse 69  Temp(Src) 97.5 F (36.4 C) (Oral)  Resp 14  Ht 5\' 8"  (1.727 m)  Wt 112.946 kg (249 lb)  BMI 37.87 kg/m2  SpO2 96%  Intake/Output Summary (Last 24 hours) at 06/15/14 1344 Last data filed at 06/15/14 1317  Gross per 24 hour  Intake  712.5 ml  Output      0 ml  Net  712.5 ml   Weight change: -0.454 kg (-1 lb)  Physical  Exam: General appearance: alert, cooperative, appears stated age and moderately obese  Resp: clear to auscultation bilaterally  Chest wall: no tenderness  Cardio: regular rate and rhythm, S1, S2 normal, no murmur, click, rub or gallop  GI: soft, non-tender; bowel sounds normal; no masses, no organomegaly and obese  Extremities: edema 2+  Pulses: 2+ and symmetric    Imaging: Ir Fluoro Guide Cv Line Right  06/13/2014   CLINICAL DATA:  ESRD needing dialysis  TECHNIQUE: RIGHT IJ CATHETER PLACEMENT UNDER ULTRASOUND AND FLUOROSCOPIC GUIDANCE  The procedure, risks (including but not limited to bleeding, infection, organ damage, pneumothorax), benefits, and alternatives were explained to the patient. Questions regarding the procedure were encouraged and answered. The patient understands and consents to the procedure. Patency of the right IJ vein was confirmed with ultrasound with image documentation. An appropriate skin site was determined. Skin site was marked. Region was prepped using maximum barrier technique including cap and mask, sterile gown, sterile gloves, large sterile sheet, and Chlorhexidine as cutaneous antisepsis. The region was infiltrated locally with 1% lidocaine. Under real-time ultrasound guidance, the right IJ vein was accessed with a 19 gauge needle; the needle tip within the vein was confirmed with ultrasound image documentation. The needle exchanged over a guidewire for vascular dilator which  allowed advancement of a 20 cm Trialysis catheter. This was positioned with the tip at the cavoatrial junction. Spot chest radiograph shows good positioning and no pneumothorax. Catheter was flushed and sutured externally with 0-Prolene sutures. Patient tolerated the procedure well, with no immediate complication.  IMPRESSION: 1. Technically successful right IJ Trialysis catheter placement.   Electronically Signed   By: Oley Balm M.D.   On: 06/13/2014 16:46   Ir US Guide Vasc Access  Right  06/13/2014   CLINICAL DATA:  ESRD needing dialysis  TECHNIQUE: RIGHT IJ CATHETER PLACEMENT UNDER ULTRASOUND AND FLUOROSCOPIC GUIDANCE  The procedure, risks (including but not limited to bleeding, infection, organ damage, pneumothorax), benefits, and alternatives were explained to the patient. Questions regarding the procedure were encouraged and answered. The patient understands and consents to the procedure. Patency of the right IJ vein was confirmed with ultrasound with image documentation. An appropriate skin site was determined. Skin site was marked. Region was prepped using maximum barrier technique including cap and mask, sterile gown, sterile gloves, large sterile sheet, and Chlorhexidine as cutaneous antisepsis. The region was infiltrated locally with 1% lidocaine. Under real-time ultrasound guidance, the right IJ vein was accessed with a 19 gauge needle; the needle tip within the vein was confirmed with ultrasound image documentation. The needle exchanged over a guidewire for vascular dilator which allowed advancement of a 20 cm Trialysis catheter. This was positioned with the tip at the cavoatrial junction. Spot chest radiograph shows good positioning and no pneumothorax. Catheter was flushed and sutured externally with 0-Prolene sutures. Patient tolerated the procedure well, with no immediate complication.  IMPRESSION: 1. Technically successful right IJ Trialysis catheter placement.   Electronically Signed   By: Oley Balm M.D.   On: 06/13/2014 16:46   Dg Chest Port 1 View  06/15/2014   CLINICAL DATA:  Right central line placement.  EXAM: PORTABLE CHEST - 1 VIEW  COMPARISON:  06/11/2014  FINDINGS: Right dialysis catheter is in place. The tips are in the right atrium. No pneumothorax.  Cardiomegaly with vascular congestion and interstitial prominence, likely mild interstitial edema. No visible effusions or acute bony abnormality.  IMPRESSION: Right dialysis catheter tips in the right  atrium. No visible pneumothorax.  Mild interstitial edema/CHF.   Electronically Signed   By: Charlett Nose M.D.   On: 06/15/2014 11:32    Labs: BMET  Recent Labs Lab 06/13/14 1004 06/14/14 0500 06/15/14 0439  NA 139 138 138  K 3.3* 3.5* 3.3*  CL 95* 96 95*  CO2 31 30 30   GLUCOSE 71 115* 55*  BUN 29* 26* 36*  CREATININE 7.25* 6.45* 7.92*  CALCIUM 7.5* 7.1* 7.3*  PHOS  --  4.5  --    CBC  Recent Labs Lab 06/13/14 1004 06/14/14 0500 06/15/14 0439  WBC 8.1 8.2 8.4  HGB 8.7* 7.1* 6.7*  HCT 26.8* 22.0* 20.8*  MCV 102.3* 102.3* 102.0*  PLT 317 260 259    Medications:    . allopurinol  100 mg Oral Daily  . amLODipine  10 mg Oral Daily  . aspirin EC  81 mg Oral Daily  . atorvastatin  20 mg Oral q1800  . carvedilol  25 mg Oral BID WC  . [START ON 06/16/2014] darbepoetin (ARANESP) injection - DIALYSIS  60 mcg Intravenous Q Sat-HD  . dextrose      . furosemide  120 mg Oral Daily  . heparin  5,000 Units Subcutaneous 3 times per day  . insulin aspart  0-9 Units Subcutaneous  TID WC  . loratadine  10 mg Oral Daily  . nitroGLYCERIN  0.2 mg Transdermal Q24H  . pantoprazole  80 mg Oral Daily      Paulene FloorJames Karthikeya Funke, MD 06/15/2014, 1:44 PM

## 2014-06-15 NOTE — OR Nursing (Signed)
Nitrogycerine patch located on right chest removed prior to prepping per Dr. Arbie CookeyEarly.

## 2014-06-15 NOTE — Transfer of Care (Signed)
Immediate Anesthesia Transfer of Care Note  Patient: [name]  Procedure(s) Performed: Procedure(s): INSERTION OF DIALYSIS CATHETER  Patient Location: PACU  Anesthesia Type:General  Level of Consciousness: awake, alert  and oriented  Airway & Oxygen Therapy: Patient Spontanous Breathing and Patient connected to face mask oxygen  Post-op Assessment: Report given to PACU RN  Post vital signs: Reviewed and stable  Complications: No apparent anesthesia complications

## 2014-06-16 LAB — BASIC METABOLIC PANEL
Anion gap: 14 (ref 5–15)
BUN: 26 mg/dL — AB (ref 6–23)
CO2: 25 mEq/L (ref 19–32)
CREATININE: 6.02 mg/dL — AB (ref 0.50–1.35)
Calcium: 7.3 mg/dL — ABNORMAL LOW (ref 8.4–10.5)
Chloride: 99 mEq/L (ref 96–112)
GFR calc Af Amer: 9 mL/min — ABNORMAL LOW (ref >90)
GFR, EST NON AFRICAN AMERICAN: 8 mL/min — AB (ref >90)
Glucose, Bld: 116 mg/dL — ABNORMAL HIGH (ref 70–99)
Potassium: 3.6 mEq/L — ABNORMAL LOW (ref 3.7–5.3)
Sodium: 138 mEq/L (ref 137–147)

## 2014-06-16 LAB — TYPE AND SCREEN
ABO/RH(D): A POS
Antibody Screen: NEGATIVE
Unit division: 0
Unit division: 0

## 2014-06-16 LAB — CBC
HCT: 25.2 % — ABNORMAL LOW (ref 39.0–52.0)
HEMATOCRIT: 29.5 % — AB (ref 39.0–52.0)
Hemoglobin: 9.5 g/dL — ABNORMAL LOW (ref 13.0–17.0)
Hemoglobin: 8.2 g/dL — ABNORMAL LOW (ref 13.0–17.0)
MCH: 31.9 pg (ref 26.0–34.0)
MCH: 31.8 pg (ref 26.0–34.0)
MCHC: 32.2 g/dL (ref 30.0–36.0)
MCHC: 32.5 g/dL (ref 30.0–36.0)
MCV: 99 fL (ref 78.0–100.0)
MCV: 97.7 fL (ref 78.0–100.0)
PLATELETS: 226 10*3/uL (ref 150–400)
Platelets: 253 10*3/uL (ref 150–400)
RBC: 2.98 MIL/uL — ABNORMAL LOW (ref 4.22–5.81)
RBC: 2.58 MIL/uL — ABNORMAL LOW (ref 4.22–5.81)
RDW: 14.8 % (ref 11.5–15.5)
RDW: 15.3 % (ref 11.5–15.5)
WBC: 8.5 10*3/uL (ref 4.0–10.5)
WBC: 7.5 10*3/uL (ref 4.0–10.5)

## 2014-06-16 LAB — GLUCOSE, CAPILLARY
Glucose-Capillary: 100 mg/dL — ABNORMAL HIGH (ref 70–99)
Glucose-Capillary: 207 mg/dL — ABNORMAL HIGH (ref 70–99)

## 2014-06-16 MED ORDER — DARBEPOETIN ALFA-POLYSORBATE 60 MCG/0.3ML IJ SOLN
INTRAMUSCULAR | Status: AC
  Filled 2014-06-16: qty 0.3

## 2014-06-16 NOTE — Progress Notes (Signed)
[name] Progress Note    Assessment:  1. ESRD - new start HD after failed PD. 2. 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 3. DM 4. HTN 5. PAD 6. H/O CVA Plan  1. Will dialyze again today for UF; likely will need daily HD for UF to obtain dry weight but we can get to EDW as an outpatient. 2. Almost certainly will not reach dry weight for a few weeks.  3. Since he's in the hospital we should try to have general surgery remove the right sided PD cath  --> already spoken with G.Sx and either next week or as outpatient. Agree that it's not emergent.  4. Will check phos, PTH to manage renal osteodystrophy --> start Hectorol with each treatment and stop the rocaltrol.  5. Stop HCO3 tablets as well now that he's on dialysis.  6. Will also check iron studies for anemia management --> no IV iron with ferrtin greater than 1000.  7. Transfuse 2 units PRBC.  8. Avoid aggressive control of hypertension in order to allow challenging and determining of his dry weight.  9. If no response will d/c lasix as well.  * He does not need SW to find a dialysis unit. SW can call 48 hrs prior to speak with Dr. Dyanne Iha [phone]   I contacted Dr. Dyanne Iha today (7/25) the primary nephrologist and he can set up a dialysis chair for him. He will call the patient and notify him when to come to dialysis. Most likely will be TTS.  Subjective:   He feels better and denies dypsnea, nausea, diarrhea or chest pain.  No events overnight. He's tolerating dialysis UF.     Objective:   BP 136/74  Pulse 69  Temp(Src) 98.6 F (37 C) (Oral)  Resp 18  Ht 5\' 8"  (1.727 m)  Wt 111.9 kg (246 lb 11.1 oz)  BMI 37.52 kg/m2  SpO2 95%  Intake/Output Summary (Last 24 hours) at 06/16/14 4782 Last data filed at 06/16/14 9562  Gross per 24 hour  Intake 1072.5 ml  Output   2527 ml  Net -1454.5 ml   Weight change: 0.054 kg (1.9 oz)  Physical  Exam: General appearance: alert, cooperative, appears stated age and moderately obese  Resp: clear to auscultation bilaterally  Chest wall: no tenderness ; right IJ tunneled catheter Cardio: regular rate and rhythm, S1, S2 normal, no murmur, click, rub or gallop  GI: soft, non-tender; bowel sounds normal; no masses, no organomegaly and obese  Extremities: edema 2+  Pulses: 2+ and symmetric    Imaging: Dg Chest Port 1 View  06/15/2014   CLINICAL DATA:  Right central line placement.  EXAM: PORTABLE CHEST - 1 VIEW  COMPARISON:  06/11/2014  FINDINGS: Right dialysis catheter is in place. The tips are in the right atrium. No pneumothorax.  Cardiomegaly with vascular congestion and interstitial prominence, likely mild interstitial edema. No visible effusions or acute bony abnormality.  IMPRESSION: Right dialysis catheter tips in the right atrium. No visible pneumothorax.  Mild interstitial edema/CHF.   Electronically Signed   By: Charlett Nose M.D.   On: 06/15/2014 11:32    Labs: BMET  Recent Labs Lab 06/13/14 1004 06/14/14 0500 06/15/14 0439 06/16/14 0449  NA 139 138 138 138  K 3.3* 3.5* 3.3* 3.6*  CL 95* 96 95* 99  CO2 31 30 30 25   GLUCOSE 71 115* 55* 116*  BUN 29* 26* 36* 26*  CREATININE 7.25* 6.45*  7.92* 6.02*  CALCIUM 7.5* 7.1* 7.3* 7.3*  PHOS  --  4.5  --   --    CBC  Recent Labs Lab 06/14/14 0500 06/15/14 0439 06/15/14 2032 06/16/14 0449  WBC 8.2 8.4 8.5 7.5  HGB 7.1* 6.7* 9.5* 8.2*  HCT 22.0* 20.8* 29.5* 25.2*  MCV 102.3* 102.0* 99.0 97.7  PLT 260 259 253 226    Medications:    . allopurinol  100 mg Oral Daily  . amLODipine  10 mg Oral Daily  . aspirin EC  81 mg Oral Daily  . atorvastatin  20 mg Oral q1800  . carvedilol  25 mg Oral BID WC  . darbepoetin      . darbepoetin (ARANESP) injection - DIALYSIS  60 mcg Intravenous Q Sat-HD  . furosemide  120 mg Oral Daily  . heparin  5,000 Units Subcutaneous 3 times per day  . insulin aspart  0-9 Units Subcutaneous  TID WC  . loratadine  10 mg Oral Daily  . nitroGLYCERIN  0.2 mg Transdermal Q24H  . pantoprazole  80 mg Oral Daily      Noah FloorJames Kaytie Ratcliffe, MD 06/16/2014, 9:21 AM

## 2014-06-16 NOTE — Progress Notes (Signed)
[facility] PROGRESS NOTE  Noah PhiStuart Juarez [id number] DOB: November 26, 1933 DOA: 06/13/2014 PCP: Lisette AbuAMIN,ASIM, MD Interim summary: Noah PhiStuart Juarez is a 80 y.o. male with ESRD, previously on peritoneal dialysis who is being switched over to HD due to PD not removing enough fluid from the patient. He came in to the hospital today initially for placement of a tunneled catheter; however, this couldn't be performed due to patient being on plavix. As a result, a non tunneled temp catheter was placed into his RIJ and HD performed via this. Nephrology has called me to ask for admission for the patient because he cant really safely leave the hospital with an RIJ non tunneled catheter in place. [facility] and vascular surgery consulted for tunnlled catheter placement. He had right side tunneled HD catheter placement and we will plan for discharge in the next 24 hours.   Assessment/Plan: ESRD on HD: Admitted and called vascular for tunneled catheter placement. Stopping the plavix. He underwent right IJ tunneled HD catheter placement.   Diabetes mellitus: D/c oral hypoglecemics.  SSI.  CBG (last 3)   Recent Labs  06/15/14 1202 06/15/14 2035 06/16/14 1056  GLUCAP 89 113* 100*     Accelerated Hypertension: Not well Controlled.  Added on hydralazine.    Anemia: Macrocytic. Anemia panel ordered.  2unit of prbc transfusion ordered.  Repeat H&H in am.   Mild hypokalemia: Continue to monitor.   DVT prophylaxis.    Code Status: full code Family Communication: none at bedside Disposition Plan: pending.    Consultants:  Renal  Vascular  pt  Procedures:  none  Antibiotics:  none  HPI/Subjective: Reports he is not feeling well today. Requesting help with his lunch.   Objective: Filed Vitals:   06/16/14 1059  BP: 136/82  Pulse: 74  Temp: 98.4 F (36.9 C)  Resp: 20    Intake/Output Summary (Last 24 hours) at 06/16/14 1602 Last data filed at  06/16/14 1021  Gross per 24 hour  Intake    120 ml  Output   5302 ml  Net  -5182 ml   Filed Weights   06/15/14 2028 06/16/14 0651 06/16/14 1021  Weight: 111.6 kg (246 lb 0.5 oz) 111.9 kg (246 lb 11.1 oz) 108.7 kg (239 lb 10.2 oz)    Exam:   General:  Alert afebrile comfortable  Cardiovascular: s1s2  Respiratory: ctab, diminished air entry at bases.   Abdomen: obese , NT ND BS+  Musculoskeletal:  Trace pedal edema.  Data Reviewed: Basic Metabolic Panel:  Recent Labs Lab 06/13/14 1004 06/14/14 0500 06/15/14 0439 06/16/14 0449  NA 139 138 138 138  K 3.3* 3.5* 3.3* 3.6*  CL 95* 96 95* 99  CO2 31 30 30 25   GLUCOSE 71 115* 55* 116*  BUN 29* 26* 36* 26*  CREATININE 7.25* 6.45* 7.92* 6.02*  CALCIUM 7.5* 7.1* 7.3* 7.3*  PHOS  --  4.5  --   --    Liver Function Tests: No results found for this basename: AST, ALT, ALKPHOS, BILITOT, PROT, ALBUMIN,  in the last 168 hours No results found for this basename: LIPASE, AMYLASE,  in the last 168 hours No results found for this basename: AMMONIA,  in the last 168 hours CBC:  Recent Labs Lab 06/13/14 1004 06/14/14 0500 06/15/14 0439 06/15/14 2032 06/16/14 0449  WBC 8.1 8.2 8.4 8.5 7.5  HGB 8.7* 7.1* 6.7* 9.5* 8.2*  HCT 26.8* 22.0* 20.8* 29.5* 25.2*  MCV 102.3* 102.3* 102.0* 99.0  97.7  PLT 317 260 259 253 226   Cardiac Enzymes: No results found for this basename: CKTOTAL, CKMB, CKMBINDEX, TROPONINI,  in the last 168 hours BNP (last 3 results) No results found for this basename: PROBNP,  in the last 8760 hours CBG:  Recent Labs Lab 06/15/14 0932 06/15/14 1042 06/15/14 1202 06/15/14 2035 06/16/14 1056  GLUCAP 62* 97 89 113* 100*    No results found for this or any previous visit (from the past 240 hour(s)).   Studies: Dg Chest Port 1 View  06/15/2014   CLINICAL DATA:  Right central line placement.  EXAM: PORTABLE CHEST - 1 VIEW  COMPARISON:  06/11/2014  FINDINGS: Right dialysis catheter is in place. The tips  are in the right atrium. No pneumothorax.  Cardiomegaly with vascular congestion and interstitial prominence, likely mild interstitial edema. No visible effusions or acute bony abnormality.  IMPRESSION: Right dialysis catheter tips in the right atrium. No visible pneumothorax.  Mild interstitial edema/CHF.   Electronically Signed   By: Charlett Nose M.D.   On: 06/15/2014 11:32    Scheduled Meds: . allopurinol  100 mg Oral Daily  . amLODipine  10 mg Oral Daily  . aspirin EC  81 mg Oral Daily  . atorvastatin  20 mg Oral q1800  . carvedilol  25 mg Oral BID WC  . darbepoetin      . darbepoetin (ARANESP) injection - DIALYSIS  60 mcg Intravenous Q Sat-HD  . furosemide  120 mg Oral Daily  . heparin  5,000 Units Subcutaneous 3 times per day  . insulin aspart  0-9 Units Subcutaneous TID WC  . loratadine  10 mg Oral Daily  . nitroGLYCERIN  0.2 mg Transdermal Q24H  . pantoprazole  80 mg Oral Daily   Continuous Infusions:   Principal Problem:   Problem with dialysis access Active Problems:   ESRD needing dialysis   DM2 (diabetes mellitus, type 2)   HTN (hypertension)    Time spent: 20 minutes.     [facility]  [facility] Pager [phone] If 7PM-7AM, please contact night-coverage at [email or web address], password [facility] 06/16/2014, 4:02 PM  LOS: 3 days

## 2014-06-17 LAB — GLUCOSE, CAPILLARY
GLUCOSE-CAPILLARY: 103 mg/dL — AB (ref 70–99)
GLUCOSE-CAPILLARY: 154 mg/dL — AB (ref 70–99)
GLUCOSE-CAPILLARY: 170 mg/dL — AB (ref 70–99)
Glucose-Capillary: 156 mg/dL — ABNORMAL HIGH (ref 70–99)

## 2014-06-17 MED ORDER — ATORVASTATIN CALCIUM 20 MG PO TABS: 20.0000 mg | ORAL_TABLET | Freq: Every day | ORAL | Status: DC

## 2014-06-17 MED ORDER — AMLODIPINE BESYLATE 10 MG PO TABS: 10.0000 mg | ORAL_TABLET | Freq: Every day | ORAL | Status: DC

## 2014-06-17 NOTE — Progress Notes (Signed)
Discharge instructions reviewed with pt;; allowing time for questions. Pt verbalized understanding. IV removed without issue. Prescriptions given to pt. Pt understands that he is to call and f/u with Dr. Fausto SkillernBefakadu on Monday regarding dialysis schedule. Pt also understands that the Home Health agency will make contact with him on Monday regarding care; instructed pt to call agency if he had not spoken with them on tomorrow. Pt awaiting son to pick him up for discharge.

## 2014-06-17 NOTE — Progress Notes (Signed)
[facility] Progress Note    Assessment:  1. ESRD - new start HD after failed PD. 2. 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 3. DM 4. HTN 5. PAD 6. H/O CVA Plan  1. Will dialyze again today for UF; likely will need daily HD for UF to obtain dry weight but we can get to EDW as an outpatient.  2. Almost certainly will not reach dry weight for a few weeks.  3. Since he's in the hospital we should try to have general surgery remove the right sided PD cath  --> already spoken with G.Sx and either next week or as outpatient. Agree that it's not emergent.  4. Will check phos, PTH to manage renal osteodystrophy --> start Hectorol with each treatment and stop the rocaltrol.  5. Stop HCO3 tablets as well now that he's on dialysis.  6. Will also check iron studies for anemia management --> no IV iron with ferrtin greater than 1000.  7. Transfuse 2 units PRBC.  8. Avoid aggressive control of hypertension in order to allow challenging and determining of his dry weight.  9. If no response will d/c lasix as well.  * He does not need SW to find a dialysis unit. SW can call 48 hrs prior to speak with Dr. Dyanne Iha [phone]  I contacted Dr. Dyanne Iha today (7/25) the primary nephrologist and he can set up a dialysis chair for him. He will call the patient and notify him when to come to dialysis.  Dr. Dyanne Iha will patient and set him up with what appears to be MWF. Plan on HD in AM if he's still here.    Subjective:   He feels better and denies dypsnea, nausea, diarrhea or chest pain.  No events overnight. He's tolerating dialysis UF.     Objective:   BP 177/72  Pulse 72  Temp(Src) 98.1 F (36.7 C) (Oral)  Resp 17  Ht 5\' 8"  (1.727 m)  Wt 110.36 kg (243 lb 4.8 oz)  BMI 37.00 kg/m2  SpO2 97%  Intake/Output Summary (Last 24 hours) at 06/17/14 0859 Last data filed at 06/17/14 0981  Gross per 24 hour  Intake    240 ml   Output   3200 ml  Net  -2960 ml   Weight change: -4.3 kg (-9 lb 7.7 oz)  Physical Exam: General appearance: alert, cooperative, appears stated age and moderately obese  Resp: clear to auscultation bilaterally  Chest wall: no tenderness ; right IJ tunneled catheter  Cardio: regular rate and rhythm, S1, S2 normal, no murmur, click, rub or gallop  GI: soft, non-tender; bowel sounds normal; no masses, no organomegaly and obese  Extremities: edema 2+  Pulses: 2+ and symmetric    Imaging: Dg Chest Port 1 View  06/15/2014   CLINICAL DATA:  Right central line placement.  EXAM: PORTABLE CHEST - 1 VIEW  COMPARISON:  06/11/2014  FINDINGS: Right dialysis catheter is in place. The tips are in the right atrium. No pneumothorax.  Cardiomegaly with vascular congestion and interstitial prominence, likely mild interstitial edema. No visible effusions or acute bony abnormality.  IMPRESSION: Right dialysis catheter tips in the right atrium. No visible pneumothorax.  Mild interstitial edema/CHF.   Electronically Signed   By: Charlett Nose M.D.   On: 06/15/2014 11:32    Labs: BMET  Recent Labs Lab 06/13/14 1004 06/14/14 0500 06/15/14 0439 06/16/14 0449  NA 139 138 138 138  K 3.3* 3.5* 3.3* 3.6*  CL 95* 96 95* 99  CO2 31 30 30 25   GLUCOSE 71 115* 55* 116*  BUN 29* 26* 36* 26*  CREATININE 7.25* 6.45* 7.92* 6.02*  CALCIUM 7.5* 7.1* 7.3* 7.3*  PHOS  --  4.5  --   --    CBC  Recent Labs Lab 06/14/14 0500 06/15/14 0439 06/15/14 2032 06/16/14 0449  WBC 8.2 8.4 8.5 7.5  HGB 7.1* 6.7* 9.5* 8.2*  HCT 22.0* 20.8* 29.5* 25.2*  MCV 102.3* 102.0* 99.0 97.7  PLT 260 259 253 226    Medications:    . allopurinol  100 mg Oral Daily  . amLODipine  10 mg Oral Daily  . aspirin EC  81 mg Oral Daily  . atorvastatin  20 mg Oral q1800  . carvedilol  25 mg Oral BID WC  . darbepoetin (ARANESP) injection - DIALYSIS  60 mcg Intravenous Q Sat-HD  . furosemide  120 mg Oral Daily  . heparin  5,000 Units  Subcutaneous 3 times per day  . insulin aspart  0-9 Units Subcutaneous TID WC  . loratadine  10 mg Oral Daily  . nitroGLYCERIN  0.2 mg Transdermal Q24H  . pantoprazole  80 mg Oral Daily      Paulene FloorJames Megyn Leng, MD 06/17/2014, 8:59 AM

## 2014-06-17 NOTE — Evaluation (Signed)
Physical Therapy Evaluation Patient Details Name: [name] MRN: [id number] DOB: Apr 11, 1934 Today's Date: 06/17/2014   History of Present Illness  Camilo Mander is a 80 y.o. male with ESRD, previously on peritoneal dialysis who is being switched over to HD due to PD not removing enough fluid from the patient. Now s/p RIJ permanent catheter placement, and removal of PD access.  Clinical Impression   Pt admitted with above. Pt currently with functional limitations due to the deficits listed below (see PT Problem List).  Pt will benefit from skilled PT to increase their independence and safety with mobility to allow discharge to the venue listed below.   Noted inconsistencies on pt's story re: available assist at home;  Pt very willing to work with PT, and has functional deficits to work on; Recommend SNF for postacute rehab to maximize independence and safety with mobility prior to dc home      Follow Up Recommendations SNF;Supervision/Assistance - 24 hour    Equipment Recommendations  Rolling walker with 5" wheels;3in1 (PT)    Recommendations for Other Services       Precautions / Restrictions Precautions Precautions: Fall      Mobility  Bed Mobility                  Transfers Overall transfer level: Needs assistance Equipment used: Rolling walker (2 wheeled) Transfers: Sit to/from Stand Sit to Stand: Mod assist         General transfer comment: Cues for technique, safety, and hand placement; unable to use RUE to push; Mod antigravity assist  Ambulation/Gait Ambulation/Gait assistance: Min assist Ambulation Distance (Feet): 15 Feet Assistive device: Rolling walker (2 wheeled) Gait Pattern/deviations: Shuffle;Decreased stride length Gait velocity: quite slow   General Gait Details: Fatigues quite quickly; Pt endorses back pain while walking; cued him to push down into RW to take pressure off his back  Stairs            Wheelchair Mobility     Modified Rankin (Stroke Patients Only)       Balance Overall balance assessment: Needs assistance           Standing balance-Leahy Scale: Poor Standing balance comment: Dependent on UE support when on his feet                             Pertinent Vitals/Pain Reports mostly back pain with walking; did not rate;  patient repositioned for comfort     Home Living Family/patient expects to be discharged to:: Private residence Living Arrangements: Children Available Help at Discharge: Family (pt states he has 24 hour assist; will need to be verified) Type of Home: House Home Access: Ramped entrance     Home Layout: One level Home Equipment: Walker - 4 wheels      Prior Function Level of Independence: Needs assistance   Gait / Transfers Assistance Needed: uses Rollator     Comments: Need to verify amount of assist available to pt     Hand Dominance        Extremity/Trunk Assessment   Upper Extremity Assessment: RUE deficits/detail RUE Deficits / Details: Pt is apparently with a previous injury that has affected his RUE function; Minimal shoulder motion against gravity; AA elbow flexion against gravite; noted significant hand swelling; elevated on pillows         Lower Extremity Assessment: Generalized weakness         Communication   Communication:  No difficulties  Cognition Arousal/Alertness: Awake/alert Behavior During Therapy: WFL for tasks assessed/performed Overall Cognitive Status: Within Functional Limits for tasks assessed                      General Comments General comments (skin integrity, edema, etc.): Small loose stool; RN notified    Exercises        Assessment/Plan    PT Assessment Patient needs continued PT services  PT Diagnosis Difficulty walking;Generalized weakness;Acute pain   PT Problem List Decreased strength;Decreased range of motion;Decreased activity tolerance;Decreased balance;Decreased  mobility;Decreased coordination;Decreased knowledge of use of DME;Pain  PT Treatment Interventions DME instruction;Gait training;Functional mobility training;Therapeutic activities;Therapeutic exercise;Patient/family education   PT Goals (Current goals can be found in the Care Plan section) Acute Rehab PT Goals Patient Stated Goal: Pt kept repeating that he feels he would "do better" if he had someone ("like you" referring to this PT) to help him at home PT Goal Formulation: With patient Time For Goal Achievement: 07/01/14 Potential to Achieve Goals: Good    Frequency Min 3X/week   Barriers to discharge Decreased caregiver support Need to verify how much assist pt has at home    Co-evaluation               End of Session Equipment Utilized During Treatment: Gait belt Activity Tolerance: Patient limited by fatigue;Patient limited by pain Patient left: in chair;with call bell/phone within reach Nurse Communication: Mobility status         Time: 1422-1500 (minus approx 10 minutes while pt on commode) PT Time Calculation (min): 38 min   Charges:   PT Evaluation $Initial PT Evaluation Tier I: 1 Procedure PT Treatments $Gait Training: 8-22 mins $Therapeutic Activity: 8-22 mins   PT G Codes:          Olen PelGarrigan, Lenford Beddow Hamff 06/17/2014, 3:49 PM  Van ClinesHolly Neelam Tiggs, [place]PT  Acute Rehabilitation Services Pager [phone] Office [phone]

## 2014-06-17 NOTE — Discharge Summary (Signed)
Physician Discharge Summary  [name] ZOX:[id number] DOB: 12-30-1933 DOA: 06/13/2014  PCP: Lisette Abu, MD  Admit date: 06/13/2014 Discharge date: 06/17/2014  Time spent: 30 minutes  Recommendations for Outpatient Follow-up:  1. Follow up with PCP in one week  2. Follow up with Dr Fausto Skillern. tomorrow  Discharge Diagnoses:  Principal Problem:   Problem with dialysis access Active Problems:   ESRD needing dialysis   DM2 (diabetes mellitus, type 2)   HTN (hypertension)   Discharge Condition: improved  Diet recommendation: renal diet.   Filed Weights   06/16/14 0651 06/16/14 1021 06/16/14 2209  Weight: 111.9 kg (246 lb 11.1 oz) 108.7 kg (239 lb 10.2 oz) 110.36 kg (243 lb 4.8 oz)    History of present illness:  [name] is a 80 y.o. male with ESRD, previously on peritoneal dialysis who is being switched over to HD due to PD not removing enough fluid from the patient. He came in to the hospital today initially for placement of a tunneled catheter; however, this couldn't be performed due to patient being on plavix. As a result, a non tunneled temp catheter was placed into his RIJ and HD performed via this. Nephrology has called me to ask for admission for the patient because he cant really safely leave the hospital with an RIJ non tunneled catheter in place. He was admitted to [facility] service for the above and vascular surgery consulted for tunnlled catheter placement. He had right side tunneled HD catheter placement and we will plan for discharge in the next 24 hours.    Hospital Course:  ESRD on HD:  Admitted and called vascular for tunneled catheter placement. Stopping the plavix. He underwent right IJ tunneled HD catheter placement.  Diabetes mellitus:   Apparently patient was on glipizide and humolog at home but his sugars have been well less than 200 in the hospital. He had an episode of hypoglycemia . We will stop all oral hypoglycemic medications. And recommend to get HGBA1C  .   CBG (last 3)   Recent Labs   06/15/14 1202  06/15/14 2035  06/16/14 1056   GLUCAP  89  113*  100*    Accelerated Hypertension:  Not well Controlled. Added hydralazine.     Anemia:  Macrocytic 2unit of prbc transfusion ordered and given.  Repeat H&H in stable.   Mild hypokalemia:  repleted as needed.      Procedures:  Right IJ tunneled HD catheter placement.    Consultations: Renal  Vascular Physical therapy  Discharge Exam: Filed Vitals:   06/17/14 0903  BP: 170/77  Pulse: 79  Temp: 98.6 F (37 C)  Resp: 19    General: alert afebrile comfortable Cardiovascular: s1s2 Respiratory: ctab  Discharge Instructions You were cared for by a hospitalist during your hospital stay. If you have any questions about your discharge medications or the care you received while you were in the hospital after you are discharged, you can call the unit and asked to speak with the hospitalist on call if the hospitalist that took care of you is not available. Once you are discharged, your primary care physician will handle any further medical issues. Please note that NO REFILLS for any discharge medications will be authorized once you are discharged, as it is imperative that you return to your primary care physician (or establish a relationship with a primary care physician if you do not have one) for your aftercare needs so that they can reassess your need for medications and monitor your  lab values.      Discharge Instructions   Discharge instructions    Complete by:  As directed   Follow up with PCP in one week Follow up with Dr Fausto Skillernbefakadu as recommended.            Medication List    STOP taking these medications       glipiZIDE 10 MG tablet  Commonly known as:  GLUCOTROL     insulin lispro 100 UNIT/ML injection  Commonly known as:  HUMALOG     simvastatin 40 MG tablet  Commonly known as:  ZOCOR      TAKE these medications       allopurinol 100 MG tablet   Commonly known as:  ZYLOPRIM  Take 100 mg by mouth daily.     amLODipine 10 MG tablet  Commonly known as:  NORVASC  Take 1 tablet (10 mg total) by mouth daily.     aspirin EC 81 MG tablet  Take 81 mg by mouth daily.     atorvastatin 20 MG tablet  Commonly known as:  LIPITOR  Take 1 tablet (20 mg total) by mouth daily at 6 PM.     calcitRIOL 0.5 MCG capsule  Commonly known as:  ROCALTROL  Take 0.5 mcg by mouth daily.     carvedilol 25 MG tablet  Commonly known as:  COREG  Take 25 mg by mouth 2 (two) times daily with a meal.     clopidogrel 75 MG tablet  Commonly known as:  PLAVIX  Take 75 mg by mouth daily.     folic acid-vitamin b complex-vitamin c-selenium-zinc 3 MG Tabs tablet  Take 1 tablet by mouth daily.     furosemide 40 MG tablet  Commonly known as:  LASIX  Take 120 mg by mouth daily.     loratadine 10 MG tablet  Commonly known as:  CLARITIN  Take 10 mg by mouth daily.     nitroGLYCERIN 0.2 mg/hr patch  Commonly known as:  NITRODUR - Dosed in mg/24 hr  Place 0.2 mg onto the skin daily.     omeprazole 40 MG capsule  Commonly known as:  PRILOSEC  Take 40 mg by mouth daily.     potassium chloride 20 MEQ/15ML (10%) solution  Take 30 mEq by mouth daily.     sodium bicarbonate 650 MG tablet  Take 1,300 mg by mouth 2 (two) times daily.     sorbitol 70 % solution  Take 30 mLs by mouth daily as needed (constipation).     tiZANidine 2 MG tablet  Commonly known as:  ZANAFLEX  Take 2 mg by mouth at bedtime as needed for muscle spasms.     traMADol 50 MG tablet  Commonly known as:  ULTRAM  Take 50 mg by mouth every 12 (twelve) hours as needed for moderate pain.     zolpidem 5 MG tablet  Commonly known as:  AMBIEN  Take 2.5 mg by mouth at bedtime as needed for sleep.       Allergies  Allergen Reactions  . Hydrocodone     unknown  . Penicillins     unknown   Follow-up Information   Follow up with AMIN,ASIM, MD. Schedule an appointment as soon as  possible for a visit in 1 week.   Specialty:  Internal Medicine   Contact information:   [address] [place] [id number] [phone]       [facility]  MD On 06/18/2014.   Specialty:  Nephrology   Contact information:   [address] [place] [address] [phone]        The results of significant diagnostics from this hospitalization (including imaging, microbiology, ancillary and laboratory) are listed below for reference.    Significant Diagnostic Studies: Ir Fluoro Guide Cv Line Right  06/13/2014   CLINICAL DATA:  ESRD needing dialysis  TECHNIQUE: RIGHT IJ CATHETER PLACEMENT UNDER ULTRASOUND AND FLUOROSCOPIC GUIDANCE  The procedure, risks (including but not limited to bleeding, infection, organ damage, pneumothorax), benefits, and alternatives were explained to the patient. Questions regarding the procedure were encouraged and answered. The patient understands and consents to the procedure. Patency of the right IJ vein was confirmed with ultrasound with image documentation. An appropriate skin site was determined. Skin site was marked. Region was prepped using maximum barrier technique including cap and mask, sterile gown, sterile gloves, large sterile sheet, and Chlorhexidine as cutaneous antisepsis. The region was infiltrated locally with 1% lidocaine. Under real-time ultrasound guidance, the right IJ vein was accessed with a 19 gauge needle; the needle tip within the vein was confirmed with ultrasound image documentation. The needle exchanged over a guidewire for vascular dilator which allowed advancement of a 20 cm Trialysis catheter. This was positioned with the tip at the cavoatrial junction. Spot chest radiograph shows good positioning and no pneumothorax. Catheter was flushed and sutured externally with 0-Prolene sutures. Patient tolerated the procedure well, with no immediate complication.  IMPRESSION: 1. Technically successful  right IJ Trialysis catheter placement.   Electronically Signed   By: Oley Balm M.D.   On: 06/13/2014 16:46   Ir US Guide Vasc Access Right  06/13/2014   CLINICAL DATA:  ESRD needing dialysis  TECHNIQUE: RIGHT IJ CATHETER PLACEMENT UNDER ULTRASOUND AND FLUOROSCOPIC GUIDANCE  The procedure, risks (including but not limited to bleeding, infection, organ damage, pneumothorax), benefits, and alternatives were explained to the patient. Questions regarding the procedure were encouraged and answered. The patient understands and consents to the procedure. Patency of the right IJ vein was confirmed with ultrasound with image documentation. An appropriate skin site was determined. Skin site was marked. Region was prepped using maximum barrier technique including cap and mask, sterile gown, sterile gloves, large sterile sheet, and Chlorhexidine as cutaneous antisepsis. The region was infiltrated locally with 1% lidocaine. Under real-time ultrasound guidance, the right IJ vein was accessed with a 19 gauge needle; the needle tip within the vein was confirmed with ultrasound image documentation. The needle exchanged over a guidewire for vascular dilator which allowed advancement of a 20 cm Trialysis catheter. This was positioned with the tip at the cavoatrial junction. Spot chest radiograph shows good positioning and no pneumothorax. Catheter was flushed and sutured externally with 0-Prolene sutures. Patient tolerated the procedure well, with no immediate complication.  IMPRESSION: 1. Technically successful right IJ Trialysis catheter placement.   Electronically Signed   By: Oley Balm M.D.   On: 06/13/2014 16:46   Dg Chest Port 1 View  06/15/2014   CLINICAL DATA:  Right central line placement.  EXAM: PORTABLE CHEST - 1 VIEW  COMPARISON:  06/11/2014  FINDINGS: Right dialysis catheter is in place. The tips are in the right atrium. No pneumothorax.  Cardiomegaly with vascular congestion and interstitial prominence,  likely mild interstitial edema. No visible effusions or acute bony abnormality.  IMPRESSION: Right dialysis catheter tips in the right atrium. No visible pneumothorax.  Mild interstitial edema/CHF.   Electronically Signed   By:  Charlett Nose M.D.   On: 06/15/2014 11:32    Microbiology: No results found for this or any previous visit (from the past 240 hour(s)).   Labs: Basic Metabolic Panel:  Recent Labs Lab 06/13/14 1004 06/14/14 0500 06/15/14 0439 06/16/14 0449  NA 139 138 138 138  K 3.3* 3.5* 3.3* 3.6*  CL 95* 96 95* 99  CO2 31 30 30 25   GLUCOSE 71 115* 55* 116*  BUN 29* 26* 36* 26*  CREATININE 7.25* 6.45* 7.92* 6.02*  CALCIUM 7.5* 7.1* 7.3* 7.3*  PHOS  --  4.5  --   --    Liver Function Tests: No results found for this basename: AST, ALT, ALKPHOS, BILITOT, PROT, ALBUMIN,  in the last 168 hours No results found for this basename: LIPASE, AMYLASE,  in the last 168 hours No results found for this basename: AMMONIA,  in the last 168 hours CBC:  Recent Labs Lab 06/13/14 1004 06/14/14 0500 06/15/14 0439 06/15/14 2032 06/16/14 0449  WBC 8.1 8.2 8.4 8.5 7.5  HGB 8.7* 7.1* 6.7* 9.5* 8.2*  HCT 26.8* 22.0* 20.8* 29.5* 25.2*  MCV 102.3* 102.3* 102.0* 99.0 97.7  PLT 317 260 259 253 226   Cardiac Enzymes: No results found for this basename: CKTOTAL, CKMB, CKMBINDEX, TROPONINI,  in the last 168 hours BNP: BNP (last 3 results) No results found for this basename: PROBNP,  in the last 8760 hours CBG:  Recent Labs Lab 06/16/14 1056 06/16/14 1714 06/16/14 2205 06/17/14 0805 06/17/14 1227  GLUCAP 100* 207* 156* 103* 154*       Signed:  Shanikia Kernodle  [facility] 06/17/2014, 3:48 PM

## 2014-06-17 NOTE — Plan of Care (Signed)
Problem: Discharge Progression Outcomes Goal: Outpatient dialysis arranged Outcome: Adequate for Discharge Pt knows to f/u with neph

## 2014-06-17 NOTE — Care Management Note (Addendum)
    Page 1 of 2   06/21/2014     4:01:00 PM CARE MANAGEMENT NOTE 06/21/2014  Patient:  [name], [name]   Account Number:  [id number]  Date Initiated:  06/17/2014  Documentation initiated by:  [facility]  Subjective/Objective Assessment:   adm:  Problem with dialysis access  Active Problems:    ESRD needing dialysis    DM2 (diabetes mellitus, type 2)    HTN (hypertension     Action/Plan:   dishcarge planning   Anticipated DC Date:  06/17/2014   Anticipated DC Plan:  [place]  CM consult      [facility] Choice  HOME HEALTH   Choice offered to / List presented to:  C-1 Patient        [place] arranged  HH-1 RN  [place] agency  [facility]   Status of service:  Completed, signed off Medicare Important Message given?   (If response is "NO", the following Medicare IM given date fields will be blank) Date Medicare IM given:   Medicare IM given by:   Date Additional Medicare IM given:   Additional Medicare IM given by:    Discharge Disposition:  [place]  Per UR Regulation:    If discussed at Long Length of Stay Meetings, dates discussed:    Comments:  06/21/14 15:40  CM received voicemail  from [place] from [facility] stating they are unable to get in touch with and requesterd another number.  CMcalled back and at that time Wells Guiles stated [name] reached the pt who said he just wasnt answering the phone.  No other CM needs were communicated. Mariane Masters, BSN, [place].   06/17/14 16:15 CM met with pt in room to offer choice.  Pt lives in [place] and chooses [facility].  Pt states he will have 24 hour supervision and refuses recc. SNF.  CM verified address and contact information with pt. CM called [facility] and spoke to Missy who accepts pt and will render HHPT/OT/RN services.  Per Missy's request, faesheet, face to face, orders, DC summary, PT eval faxed to  [facility] at [phone].  No other CM needs were communicated.  Mariane Masters, BSN, [place].

## 2014-06-18 ENCOUNTER — Inpatient Hospital Stay (HOSPITAL_COMMUNITY): Admission: RE | Admit: 2014-06-18 | Payer: Medicare Other | Source: Ambulatory Visit

## 2014-06-19 ENCOUNTER — Encounter (HOSPITAL_COMMUNITY): Payer: Self-pay | Admitting: Vascular Surgery

## 2014-06-21 NOTE — Anesthesia Postprocedure Evaluation (Signed)
Anesthesia Post Note  Patient: [name]  Procedure(s) Performed: Procedure(s) (LRB): INSERTION OF DIALYSIS CATHETER (Right)  Anesthesia type: General  Patient location: PACU  Post pain: Pain level controlled and Adequate analgesia  Post assessment: Post-op Vital signs reviewed, Patient's Cardiovascular Status Stable, Respiratory Function Stable, Patent Airway and Pain level controlled  Last Vitals:  Filed Vitals:   06/17/14 0903  BP: 170/77  Pulse: 79  Temp: 37 C  Resp: 19    Post vital signs: Reviewed and stable  Level of consciousness: awake, alert  and oriented  Complications: No apparent anesthesia complications

## 2014-07-05 ENCOUNTER — Other Ambulatory Visit: Payer: Self-pay | Admitting: Vascular Surgery

## 2014-07-05 DIAGNOSIS — Z01810 Encounter for preprocedural cardiovascular examination: Secondary | ICD-10-CM

## 2014-07-05 DIAGNOSIS — N186 End stage renal disease: Secondary | ICD-10-CM

## 2014-07-17 ENCOUNTER — Encounter: Payer: Self-pay | Admitting: Vascular Surgery

## 2014-07-18 ENCOUNTER — Ambulatory Visit (INDEPENDENT_AMBULATORY_CARE_PROVIDER_SITE_OTHER)
Admission: RE | Admit: 2014-07-18 | Discharge: 2014-07-18 | Disposition: A | Discharge status: Home / Self Care | Payer: Medicare Other | Source: Ambulatory Visit | Attending: Vascular Surgery | Admitting: Vascular Surgery

## 2014-07-18 ENCOUNTER — Ambulatory Visit (INDEPENDENT_AMBULATORY_CARE_PROVIDER_SITE_OTHER): Payer: Medicare Other | Admitting: Vascular Surgery

## 2014-07-18 ENCOUNTER — Other Ambulatory Visit: Payer: Self-pay

## 2014-07-18 ENCOUNTER — Encounter: Payer: Self-pay | Admitting: Vascular Surgery

## 2014-07-18 ENCOUNTER — Ambulatory Visit (HOSPITAL_COMMUNITY)
Admission: RE | Admit: 2014-07-18 | Discharge: 2014-07-18 | Disposition: A | Discharge status: Home / Self Care | Payer: Medicare Other | Source: Ambulatory Visit | Attending: Vascular Surgery | Admitting: Vascular Surgery

## 2014-07-18 VITALS — BP 156/77 | HR 71 | Resp 16 | Ht 68.0 in | Wt 216.0 lb

## 2014-07-18 DIAGNOSIS — N186 End stage renal disease: Secondary | ICD-10-CM

## 2014-07-18 DIAGNOSIS — Z01810 Encounter for preprocedural cardiovascular examination: Secondary | ICD-10-CM

## 2014-07-19 NOTE — Progress Notes (Signed)
[facility] [place] HISTORY AND PHYSICAL   History of Present Illness:  Patient is a 80 y.o. year old male who presents for placement of a permanent hemodialysis access. The patient is left handed .  The patient is currently on hemodialysis.  He previously had a left forearm AV graft. He was then converted to peritoneal dialysis. His peritoneal dialysis is no longer effective. He was recently converted back to hemodialysis. His left forearm graft is chronically occluded.  He is currently dialyzing via a catheter Tuesday Thursday Saturday in [place].  Dr. Fausto Skillern is his nephrologist.  Other chronic medical problems include hypertension, prior stroke, diabetes, chronic right arm this use from prior fracture all of these are currently stable.  Past Medical History  Diagnosis Date  . Hypertension   . Stroke   . CHF (congestive heart failure)   . Peripheral vascular disease   . Renal disease   . High cholesterol   . GERD (gastroesophageal reflux disease)   . Type II diabetes mellitus   . Arthritis     "all over"  . Chronic lower back pain   . Gout     Past Surgical History  Procedure Laterality Date  . Cholecystectomy    . Arteriovenous graft placement Left 02/2011    [name] 03/12/2011  . Peritoneal catheter insertion      [name] 06/13/2014  . Humerus fracture surgery Right   . Insertion of dialysis catheter Right 06/15/2014    Procedure: INSERTION OF DIALYSIS CATHETER;  Surgeon: Larina Earthly, MD;  [facility];  Service: Vascular;  Laterality: Right;  Internal Jugular     Social History History  Substance Use Topics  . Smoking status: Former Smoker    Quit date: 07/18/1974  . Smokeless tobacco: Never Used  . Alcohol Use: No    Family History No family history on file.  Allergies  Allergies  Allergen Reactions  . Hydrocodone     unknown  . Penicillins     unknown     Current Outpatient Prescriptions  Medication Sig Dispense Refill  .  allopurinol (ZYLOPRIM) 100 MG tablet Take 100 mg by mouth daily.      Marland Kitchen amLODipine (NORVASC) 10 MG tablet Take 1 tablet (10 mg total) by mouth daily.  30 tablet  1  . aspirin EC 81 MG tablet Take 81 mg by mouth daily.      Marland Kitchen atorvastatin (LIPITOR) 20 MG tablet Take 1 tablet (20 mg total) by mouth daily at 6 PM.  30 tablet  1  . calcitRIOL (ROCALTROL) 0.5 MCG capsule Take 0.5 mcg by mouth daily.      . carvedilol (COREG) 25 MG tablet Take 25 mg by mouth 2 (two) times daily with a meal.      . clopidogrel (PLAVIX) 75 MG tablet Take 75 mg by mouth daily.      . folic acid-vitamin b complex-vitamin c-selenium-zinc (DIALYVITE) 3 MG TABS tablet Take 1 tablet by mouth daily.      . furosemide (LASIX) 40 MG tablet Take 120 mg by mouth daily.      Marland Kitchen loratadine (CLARITIN) 10 MG tablet Take 10 mg by mouth daily.      . nitroGLYCERIN (NITRODUR - DOSED IN MG/24 HR) 0.2 mg/hr patch Place 0.2 mg onto the skin daily.      Marland Kitchen omeprazole (PRILOSEC) 40 MG capsule Take 40 mg by mouth daily.      . potassium chloride 20 MEQ/15ML (10%) solution Take  30 mEq by mouth daily.      . sodium bicarbonate 650 MG tablet Take 1,300 mg by mouth 2 (two) times daily.      . sorbitol 70 % solution Take 30 mLs by mouth daily as needed (constipation).      . tiZANidine (ZANAFLEX) 2 MG tablet Take 2 mg by mouth at bedtime as needed for muscle spasms.      . traMADol (ULTRAM) 50 MG tablet Take 50 mg by mouth every 12 (twelve) hours as needed for moderate pain.      . zolpidem (AMBIEN) 5 MG tablet Take 2.5 mg by mouth at bedtime as needed for sleep.       No current facility-administered medications for this visit.    ROS:   General:  No weight loss, Fever, chills  HEENT: No recent headaches, no nasal bleeding, no visual changes, no sore throat  Neurologic: No dizziness, blackouts, seizures. No recent symptoms of stroke or mini- stroke. No recent episodes of slurred speech, or temporary blindness.  Cardiac: No recent episodes  of chest pain/pressure, no shortness of breath at rest.  No shortness of breath with exertion.  Denies history of atrial fibrillation or irregular heartbeat  Vascular: No history of rest pain in feet.  No history of claudication.  No history of non-healing ulcer, No history of DVT   Pulmonary: No home oxygen, no productive cough, no hemoptysis,  No asthma or wheezing  Musculoskeletal:  [ ] Arthritis, [ ] Low back pain,  [ ] Joint pain  Hematologic:No history of hypercoagulable state.  No history of easy bleeding.  No history of anemia  Gastrointestinal: No hematochezia or melena,  No gastroesophageal reflux, no trouble swallowing  Urinary: [ ] chronic Kidney disease, [x ] on HD - [ ] MWF or [x ] TTHS, [ ] Burning with urination, [ ] Frequent urination, [ ] Difficulty urinating;   Skin: No rashes  Psychological: No history of anxiety,  No history of depression   Physical Examination  Filed Vitals:   07/18/14 1115  BP: 156/77  Pulse: 71  Resp: 16  Height: 5' 8" (1.727 m)  Weight: 216 lb (97.977 kg)    Body mass index is 32.85 kg/(m^2).  General:  Alert and oriented, no acute distress HEENT: Normal Neck: No bruit or JVD Pulmonary: Clear to auscultation bilaterally Cardiac: Regular Rate and Rhythm without murmur Gastrointestinal: Soft, non-tender, non-distended, no mass Skin: No rash Extremity Pulses:  2+ radial, brachial pulses bilaterally, occluded left forearm AV graft, right upper extremity was scars from prior humerus fracture right shoulder and elbow have slight contractures. I am able to extend most of his arm passively. Musculoskeletal: No deformity other than above or edema  Neurologic: Upper and lower extremity motor 5/5 and symmetric  DATA: Vein mapping ultrasound today shows no suitable veins for a fistula. I reviewed and interpreted this study.   ASSESSMENT: Patient needs long-term hemodialysis access. He is not really able to use his right arm. He would prefer  to have access in the right arm rather than the left arm. I believe with general anesthesia which revealed extend his arm enough to place a right forearm or most likely upper arm graft.  Risks benefits possible complications and procedure details were explained to the patient today including but limited to bleeding infection ischemic steal graft thrombosis he understands and agrees to proceed. Procedure scheduled for next week with general anesthesia   PLAN:    Charles Fields, MD [facility] [place] Office: [phone] Pager: [phone]

## 2014-07-27 ENCOUNTER — Encounter (HOSPITAL_COMMUNITY): Payer: Self-pay | Admitting: Pharmacy Technician

## 2014-07-27 ENCOUNTER — Encounter (HOSPITAL_COMMUNITY): Payer: Self-pay | Admitting: *Deleted

## 2014-07-27 NOTE — Progress Notes (Signed)
Pt's stepson, Ron Jenelle Mages verified allergies, meds, medical and surgical history for the pre-op call. States pt is hard of hearing and definitely can't hear on the phone. Gave Mr. Jenelle Mages pre-op instructions.

## 2014-07-30 MED ORDER — VANCOMYCIN HCL IN DEXTROSE 1-5 GM/200ML-% IV SOLN
1000.0000 mg | INTRAVENOUS | Status: AC
  Administered 2014-07-31: 1000 mg via INTRAVENOUS
  Filled 2014-07-30: qty 200

## 2014-07-30 MED ORDER — SODIUM CHLORIDE 0.9 % IV SOLN
INTRAVENOUS | Status: DC
  Administered 2014-07-31 (×2): via INTRAVENOUS

## 2014-07-31 ENCOUNTER — Encounter (HOSPITAL_COMMUNITY): Payer: Self-pay | Admitting: Surgery

## 2014-07-31 ENCOUNTER — Ambulatory Visit (HOSPITAL_COMMUNITY)
Admission: RE | Admit: 2014-07-31 | Discharge: 2014-07-31 | Disposition: A | Discharge status: Home / Self Care | Payer: Medicare Other | Source: Ambulatory Visit | Attending: Vascular Surgery | Admitting: Vascular Surgery

## 2014-07-31 ENCOUNTER — Encounter (HOSPITAL_COMMUNITY): Payer: Medicare Other | Admitting: Anesthesiology

## 2014-07-31 ENCOUNTER — Ambulatory Visit (HOSPITAL_COMMUNITY): Payer: Medicare Other | Admitting: Anesthesiology

## 2014-07-31 ENCOUNTER — Encounter (HOSPITAL_COMMUNITY)
Admission: RE | Disposition: A | Discharge status: Home / Self Care | Payer: Self-pay | Source: Ambulatory Visit | Attending: Vascular Surgery

## 2014-07-31 DIAGNOSIS — I739 Peripheral vascular disease, unspecified: Secondary | ICD-10-CM | POA: Diagnosis not present

## 2014-07-31 DIAGNOSIS — G8929 Other chronic pain: Secondary | ICD-10-CM | POA: Diagnosis not present

## 2014-07-31 DIAGNOSIS — I120 Hypertensive chronic kidney disease with stage 5 chronic kidney disease or end stage renal disease: Secondary | ICD-10-CM | POA: Insufficient documentation

## 2014-07-31 DIAGNOSIS — N186 End stage renal disease: Secondary | ICD-10-CM

## 2014-07-31 DIAGNOSIS — Z992 Dependence on renal dialysis: Secondary | ICD-10-CM

## 2014-07-31 DIAGNOSIS — I509 Heart failure, unspecified: Secondary | ICD-10-CM | POA: Insufficient documentation

## 2014-07-31 DIAGNOSIS — E7800 Pure hypercholesterolemia, unspecified: Secondary | ICD-10-CM | POA: Diagnosis not present

## 2014-07-31 DIAGNOSIS — K219 Gastro-esophageal reflux disease without esophagitis: Secondary | ICD-10-CM | POA: Diagnosis not present

## 2014-07-31 DIAGNOSIS — M545 Low back pain, unspecified: Secondary | ICD-10-CM | POA: Insufficient documentation

## 2014-07-31 DIAGNOSIS — Z880 Allergy status to penicillin: Secondary | ICD-10-CM | POA: Diagnosis not present

## 2014-07-31 DIAGNOSIS — Z885 Allergy status to narcotic agent status: Secondary | ICD-10-CM | POA: Insufficient documentation

## 2014-07-31 DIAGNOSIS — M109 Gout, unspecified: Secondary | ICD-10-CM | POA: Insufficient documentation

## 2014-07-31 DIAGNOSIS — M1289 Other specific arthropathies, not elsewhere classified, multiple sites: Secondary | ICD-10-CM | POA: Diagnosis not present

## 2014-07-31 DIAGNOSIS — Z7982 Long term (current) use of aspirin: Secondary | ICD-10-CM | POA: Insufficient documentation

## 2014-07-31 DIAGNOSIS — Z79899 Other long term (current) drug therapy: Secondary | ICD-10-CM | POA: Diagnosis not present

## 2014-07-31 HISTORY — DX: Sleep apnea, unspecified: G47.30

## 2014-07-31 HISTORY — DX: Shortness of breath: R06.02

## 2014-07-31 HISTORY — PX: AV FISTULA PLACEMENT: SHX1204

## 2014-07-31 LAB — POCT I-STAT 4, (NA,K, GLUC, HGB,HCT)
GLUCOSE: 82 mg/dL (ref 70–99)
HCT: 34 % — ABNORMAL LOW (ref 39.0–52.0)
HEMOGLOBIN: 11.6 g/dL — AB (ref 13.0–17.0)
Potassium: 3.9 mEq/L (ref 3.7–5.3)
Sodium: 142 mEq/L (ref 137–147)

## 2014-07-31 LAB — GLUCOSE, CAPILLARY
GLUCOSE-CAPILLARY: 106 mg/dL — AB (ref 70–99)
GLUCOSE-CAPILLARY: 81 mg/dL (ref 70–99)
GLUCOSE-CAPILLARY: 82 mg/dL (ref 70–99)
GLUCOSE-CAPILLARY: 81 mg/dL (ref 70–99)
Glucose-Capillary: 91 mg/dL (ref 70–99)

## 2014-07-31 SURGERY — INSERTION OF ARTERIOVENOUS (AV) GORE-TEX GRAFT ARM
Anesthesia: General | Site: Arm Upper | Laterality: Right

## 2014-07-31 MED ORDER — FENTANYL CITRATE 0.05 MG/ML IJ SOLN
INTRAMUSCULAR | Status: AC
  Filled 2014-07-31: qty 5

## 2014-07-31 MED ORDER — ONDANSETRON HCL 4 MG/2ML IJ SOLN
INTRAMUSCULAR | Status: DC | PRN
  Administered 2014-07-31: 4 mg via INTRAVENOUS

## 2014-07-31 MED ORDER — HEPARIN SODIUM (PORCINE) 1000 UNIT/ML IJ SOLN
INTRAMUSCULAR | Status: AC
  Filled 2014-07-31: qty 1

## 2014-07-31 MED ORDER — DEXTROSE 50 % IV SOLN
6.2500 g | Freq: Once | INTRAVENOUS | Status: AC
  Administered 2014-07-31: 6.25 g via INTRAVENOUS

## 2014-07-31 MED ORDER — HYDROMORPHONE HCL PF 1 MG/ML IJ SOLN: 0.2500 mg | INTRAMUSCULAR | Status: DC | PRN

## 2014-07-31 MED ORDER — GLYCOPYRROLATE 0.2 MG/ML IJ SOLN
INTRAMUSCULAR | Status: DC | PRN
  Administered 2014-07-31: 0.2 mg via INTRAVENOUS

## 2014-07-31 MED ORDER — TRAMADOL HCL 50 MG PO TABS
ORAL_TABLET | ORAL | Status: AC
  Filled 2014-07-31: qty 1

## 2014-07-31 MED ORDER — SODIUM CHLORIDE 0.9 % IR SOLN
Status: DC | PRN
  Administered 2014-07-31: 15:00:00

## 2014-07-31 MED ORDER — DEXTROSE 50 % IV SOLN
3.0000 g | Freq: Once | INTRAVENOUS | Status: AC
  Administered 2014-07-31: 3 g via INTRAVENOUS

## 2014-07-31 MED ORDER — TRAMADOL HCL 50 MG PO TABS: 50.0000 mg | ORAL_TABLET | Freq: Four times a day (QID) | ORAL | Status: AC | PRN

## 2014-07-31 MED ORDER — HEPARIN SODIUM (PORCINE) 1000 UNIT/ML IJ SOLN
INTRAMUSCULAR | Status: DC | PRN
  Administered 2014-07-31: 10 mL via INTRAVENOUS

## 2014-07-31 MED ORDER — ONDANSETRON HCL 4 MG/2ML IJ SOLN
INTRAMUSCULAR | Status: AC
  Filled 2014-07-31: qty 2

## 2014-07-31 MED ORDER — LIDOCAINE HCL (PF) 1 % IJ SOLN
INTRAMUSCULAR | Status: AC
  Filled 2014-07-31: qty 30

## 2014-07-31 MED ORDER — THROMBIN 20000 UNITS EX SOLR
CUTANEOUS | Status: AC
  Filled 2014-07-31: qty 20000

## 2014-07-31 MED ORDER — SODIUM CHLORIDE 0.9 % IJ SOLN
INTRAMUSCULAR | Status: AC
  Filled 2014-07-31: qty 10

## 2014-07-31 MED ORDER — EPHEDRINE SULFATE 50 MG/ML IJ SOLN
INTRAMUSCULAR | Status: DC | PRN
  Administered 2014-07-31 (×3): 10 mg via INTRAVENOUS
  Administered 2014-07-31 (×2): 5 mg via INTRAVENOUS
  Administered 2014-07-31 (×2): 10 mg via INTRAVENOUS

## 2014-07-31 MED ORDER — PROPOFOL 10 MG/ML IV BOLUS
INTRAVENOUS | Status: DC | PRN
  Administered 2014-07-31: 150 mg via INTRAVENOUS

## 2014-07-31 MED ORDER — PROTAMINE SULFATE 10 MG/ML IV SOLN
INTRAVENOUS | Status: AC
  Filled 2014-07-31: qty 15

## 2014-07-31 MED ORDER — LIDOCAINE HCL (CARDIAC) 20 MG/ML IV SOLN
INTRAVENOUS | Status: DC | PRN
  Administered 2014-07-31: 100 mg via INTRAVENOUS

## 2014-07-31 MED ORDER — TRAMADOL HCL 50 MG PO TABS
50.0000 mg | ORAL_TABLET | Freq: Once | ORAL | Status: AC
Start: 2014-07-31 — End: 2014-07-31
  Administered 2014-07-31: 50 mg via ORAL

## 2014-07-31 MED ORDER — PROPOFOL 10 MG/ML IV BOLUS
INTRAVENOUS | Status: AC
  Filled 2014-07-31: qty 20

## 2014-07-31 MED ORDER — ROCURONIUM BROMIDE 50 MG/5ML IV SOLN
INTRAVENOUS | Status: AC
  Filled 2014-07-31: qty 1

## 2014-07-31 MED ORDER — CHLORHEXIDINE GLUCONATE CLOTH 2 % EX PADS: 6.0000 | MEDICATED_PAD | Freq: Once | CUTANEOUS | Status: DC

## 2014-07-31 MED ORDER — EPHEDRINE SULFATE 50 MG/ML IJ SOLN
INTRAMUSCULAR | Status: AC
  Filled 2014-07-31: qty 1

## 2014-07-31 MED ORDER — ONDANSETRON HCL 4 MG/2ML IJ SOLN: 4.0000 mg | Freq: Once | INTRAMUSCULAR | Status: DC | PRN

## 2014-07-31 MED ORDER — ARTIFICIAL TEARS OP OINT
TOPICAL_OINTMENT | OPHTHALMIC | Status: DC | PRN
  Administered 2014-07-31: 1 via OPHTHALMIC

## 2014-07-31 MED ORDER — FENTANYL CITRATE 0.05 MG/ML IJ SOLN
INTRAMUSCULAR | Status: DC | PRN
  Administered 2014-07-31: 100 ug via INTRAVENOUS
  Administered 2014-07-31: 50 ug via INTRAVENOUS

## 2014-07-31 MED ORDER — LIDOCAINE HCL (CARDIAC) 20 MG/ML IV SOLN
INTRAVENOUS | Status: AC
  Filled 2014-07-31: qty 10

## 2014-07-31 MED ORDER — STERILE WATER FOR INJECTION IJ SOLN
INTRAMUSCULAR | Status: AC
  Filled 2014-07-31: qty 10

## 2014-07-31 MED ORDER — 0.9 % SODIUM CHLORIDE (POUR BTL) OPTIME
TOPICAL | Status: DC | PRN
  Administered 2014-07-31: 1000 mL

## 2014-07-31 MED ORDER — PROTAMINE SULFATE 10 MG/ML IV SOLN
INTRAVENOUS | Status: DC | PRN
  Administered 2014-07-31: 20 mg via INTRAVENOUS
  Administered 2014-07-31 (×2): 25 mg via INTRAVENOUS
  Administered 2014-07-31: 30 mg via INTRAVENOUS

## 2014-07-31 MED ORDER — DEXTROSE 50 % IV SOLN
INTRAVENOUS | Status: AC
  Administered 2014-07-31: 6.25 g via INTRAVENOUS
  Filled 2014-07-31: qty 50

## 2014-07-31 MED ORDER — PHENYLEPHRINE HCL 10 MG/ML IJ SOLN
INTRAMUSCULAR | Status: DC | PRN
  Administered 2014-07-31: 40 ug via INTRAVENOUS

## 2014-07-31 SURGICAL SUPPLY — 27 items
ARMBAND PINK RESTRICT EXTREMIT (MISCELLANEOUS) ×2 IMPLANT
CANISTER SUCTION 2500CC (MISCELLANEOUS) ×2 IMPLANT
CLIP TI MEDIUM 6 (CLIP) ×2 IMPLANT
CLIP TI WIDE RED SMALL 6 (CLIP) ×2 IMPLANT
DECANTER SPIKE VIAL GLASS SM (MISCELLANEOUS) ×2 IMPLANT
DERMABOND ADVANCED (GAUZE/BANDAGES/DRESSINGS) ×1
DERMABOND ADVANCED .7 DNX12 (GAUZE/BANDAGES/DRESSINGS) ×1 IMPLANT
ELECT REM PT RETURN 9FT ADLT (ELECTROSURGICAL) ×2
ELECTRODE REM PT RTRN 9FT ADLT (ELECTROSURGICAL) ×1 IMPLANT
GEL ULTRASOUND 20GR AQUASONIC (MISCELLANEOUS) IMPLANT
GLOVE BIO SURGEON STRL SZ7.5 (GLOVE) ×2 IMPLANT
GOWN STRL REUS W/ TWL LRG LVL3 (GOWN DISPOSABLE) ×3 IMPLANT
GOWN STRL REUS W/TWL LRG LVL3 (GOWN DISPOSABLE) ×3
GRAFT GORETEX 6X40 (Vascular Products) ×2 IMPLANT
KIT BASIN OR (CUSTOM PROCEDURE TRAY) ×2 IMPLANT
KIT ROOM TURNOVER OR (KITS) ×2 IMPLANT
LOOP VESSEL MINI RED (MISCELLANEOUS) IMPLANT
NS IRRIG 1000ML POUR BTL (IV SOLUTION) ×2 IMPLANT
PACK CV ACCESS (CUSTOM PROCEDURE TRAY) ×2 IMPLANT
PAD ARMBOARD 7.5X6 YLW CONV (MISCELLANEOUS) ×4 IMPLANT
SPONGE SURGIFOAM ABS GEL 100 (HEMOSTASIS) IMPLANT
SUT PROLENE 6 0 CC (SUTURE) ×6 IMPLANT
SUT VIC AB 3-0 SH 27 (SUTURE) ×2
SUT VIC AB 3-0 SH 27X BRD (SUTURE) ×2 IMPLANT
SUT VICRYL 4-0 PS2 18IN ABS (SUTURE) ×4 IMPLANT
UNDERPAD 30X30 INCONTINENT (UNDERPADS AND DIAPERS) ×2 IMPLANT
WATER STERILE IRR 1000ML POUR (IV SOLUTION) ×2 IMPLANT

## 2014-07-31 NOTE — Anesthesia Preprocedure Evaluation (Addendum)
Anesthesia Evaluation  Patient identified by MRN, date of birth, ID band Patient awake    Reviewed: Allergy & Precautions, H&P , Patient's Chart, lab work & pertinent test results  Airway Mallampati: II TM Distance: >3 FB Neck ROM: full    Dental  (+) Teeth Intact, Dental Advidsory Given   Pulmonary sleep apnea , former smoker,          Cardiovascular hypertension, + Peripheral Vascular Disease and +CHF     Neuro/Psych CVA    GI/Hepatic GERD-  ,  Endo/Other  diabetes, Type 2, Insulin Dependent  Renal/GU ESRF and DialysisRenal disease     Musculoskeletal   Abdominal   Peds  Hematology   Anesthesia Other Findings   Reproductive/Obstetrics                          Anesthesia Physical Anesthesia Plan  ASA: IV  Anesthesia Plan: General   Post-op Pain Management:    Induction: Intravenous  Airway Management Planned: Oral ETT  Additional Equipment:   Intra-op Plan:   Post-operative Plan: Extubation in OR  Informed Consent: I have reviewed the patients History and Physical, chart, labs and discussed the procedure including the risks, benefits and alternatives for the proposed anesthesia with the patient or authorized representative who has indicated his/her understanding and acceptance.   Dental Advisory Given  Plan Discussed with: CRNA, Anesthesiologist and Surgeon  Anesthesia Plan Comments:        Anesthesia Quick Evaluation

## 2014-07-31 NOTE — Progress Notes (Signed)
After getting pt dressed and up in [facility], he said right upper arm "felt numb". Good bruit & thrill, right fingers warm, cap refill <3sec, no numbness or tingling in hand or fingers. Will cont to monitor for a bit.

## 2014-07-31 NOTE — Progress Notes (Signed)
Pt has pre existing Diatek in right upper chest. Blue port is accessed, red is capped 7 clamped. Dsg CDI and occlusive.

## 2014-07-31 NOTE — Op Note (Signed)
Procedure: Right upper arm AV graft  Preoperative diagnosis: End-stage renal disease  Postoperative diagnosis: Same  Anesthesia: Gen.  Assistant: Lianne Cure PA-C  Operative findings: #1  6 mm tapered PTFE graft end to side brachial artery, end to side axillary vein, upper arm loop            Operative details: After obtaining informed consent, the patient was taken to the operating room. The patient was placed in supine position on the operating table.   Attention was then turned to the patient's right upper extremity. The entire right upper extremity was prepped and draped in usual sterile fashion. Several areas of built up towels were placed due to the patient's elbow and shoulder contracture to keep the patient in the position that we could reach the right axilla. Next a longitudinal incision was made in the right axilla carried onto the subcutaneous tissues down to level of the axillary vein and brachial artery.  The brachial artery was dissected free circumferentially and small side branches ligated and divided between silk ties. The axillary vein was dissected free circumferentially. There was a large branch posteriorly. This was not divided as it would compromise lumen of the vein. Therefore it was decided to do an end to side anastomosis to the vein. Next a subcutaneous tunnel was created in a loop configuration with a transverse incision in the apex of the loop for assistance in tunneling. A 6 mm tapered PTFE graft was brought through this subcutaneous tunnel. The graft was out over the biceps and on the more medial aspect of the upper arm. The patient was given 10000 units of intravenous heparin. After 2 minutes of circulation time, the artery was controlled proximally and distally with fistula clamps. A longitudinal opening was made in the brachial artery the end of graft was beveled and sewn end of graft to side of artery using running 6-0 Prolene suture. Just prior to completion of the  anastomosis, everything was forebled backbled and thoroughly flushed. The anastomosis was secured and the clamps released. There was pulsatile flow in the graft immediately. The graft was then pulled taut to length. The vein was controlled proximally and distally with fine bulldog clamps. A large posterior branch was also controlled with a fine bulldog clamp. A longitudinal opening was made in the vein. There was a valve at this location and this was lysed under direct vision. The graft was pulled taut to length and beveled and sewn end of graft to side of vein using a running 6-0 Prolene suture. Just prior to completion of the anastomosis everything was forebled backbled and thoroughly flushed and the anastomosis was secured.  The clamps were released and there were a palpable thrill in the graft immediately. Hemostasis was obtained with direct pressure and the assistance of 100 mg of protamine. The subcutaneous tissues of both incisions were reapproximated using a running 3-0 Vicryl suture.  The skin of both incisions was closed with a 4 0 Vicryl subcuticular stitch. Dermabond was applied to both incisions. The patient tolerated procedure well and there were no complications. Instrument sponge and needle counts were correct at the end of the case. The patient was taken to the recovery room in stable condition.  Fabienne Bruns, MD [facility] [place] Office: [phone] Pager: [phone]

## 2014-07-31 NOTE — Interval H&P Note (Signed)
History and Physical Interval Note:  07/31/2014 9:14 AM  [name]  has presented today for surgery, with the diagnosis of End stage renal disease   The various methods of treatment have been discussed with the patient and family. After consideration of risks, benefits and other options for treatment, the patient has consented to  Procedure(s): INSERTION OF ARTERIOVENOUS (AV) GORE-TEX GRAFT ARM (Right) as a surgical intervention .  The patient's history has been reviewed, patient examined, no change in status, stable for surgery.  I have reviewed the patient's chart and labs.  Questions were answered to the patient's satisfaction.     Brunilda Eble E

## 2014-07-31 NOTE — Transfer of Care (Signed)
Immediate Anesthesia Transfer of Care Note  Patient: [name]  Procedure(s) Performed: Procedure(s): INSERTION OF ARTERIOVENOUS (AV) GORE-TEX GRAFT Right Upper  ARM (Right)  Patient Location: PACU  Anesthesia Type:General  Level of Consciousness: awake and alert   Airway & Oxygen Therapy: Patient Spontanous Breathing and Patient connected to nasal cannula oxygen  Post-op Assessment: Report given to PACU RN and Post -op Vital signs reviewed and stable  Post vital signs: Reviewed and stable  Complications: No apparent anesthesia complications

## 2014-07-31 NOTE — Progress Notes (Signed)
Pt sched. For HD tomorrow at 1230. HD access record faxed to center in [place].

## 2014-07-31 NOTE — Anesthesia Procedure Notes (Addendum)
Procedure Name: Intubation Date/Time: 07/31/2014 2:33 PM Performed by: Reine Just Pre-anesthesia Checklist: Patient identified, Emergency Drugs available, Suction available, Patient being monitored and Timeout performed Patient Re-evaluated:Patient Re-evaluated prior to inductionOxygen Delivery Method: Circle system utilized and Simple face mask Preoxygenation: Pre-oxygenation with 100% oxygen Intubation Type: IV induction Ventilation: Mask ventilation without difficulty Laryngoscope Size: Mac and 3 Grade View: Grade I Tube type: Oral Tube size: 7.5 mm Number of attempts: 1 Airway Equipment and Method: Patient positioned with wedge pillow and Stylet Placement Confirmation: ETT inserted through vocal cords under direct vision,  positive ETCO2 and breath sounds checked- equal and bilateral Secured at: 24 cm Tube secured with: Tape Dental Injury: Teeth and Oropharynx as per pre-operative assessment  Comments: Performed by Elenore Rota, crna

## 2014-07-31 NOTE — Anesthesia Postprocedure Evaluation (Signed)
  Anesthesia Post-op Note  Patient: [name]  Procedure(s) Performed: Procedure(s): INSERTION OF ARTERIOVENOUS (AV) GORE-TEX GRAFT Right Upper  ARM (Right)  Patient Location: PACU  Anesthesia Type:General  Level of Consciousness: awake, oriented, sedated and patient cooperative  Airway and Oxygen Therapy: Patient Spontanous Breathing  Post-op Pain: mild  Post-op Assessment: Post-op Vital signs reviewed, Patient's Cardiovascular Status Stable, Respiratory Function Stable, Patent Airway, No signs of Nausea or vomiting and Pain level controlled  Post-op Vital Signs: stable  Last Vitals:  Filed Vitals:   07/31/14 1700  BP:   Pulse: 60  Temp:   Resp: 16    Complications: No apparent anesthesia complications

## 2014-07-31 NOTE — H&P (View-Only) (Signed)
[facility] [place] HISTORY AND PHYSICAL   History of Present Illness:  Patient is a 80 y.o. year old male who presents for placement of a permanent hemodialysis access. The patient is left handed .  The patient is currently on hemodialysis.  He previously had a left forearm AV graft. He was then converted to peritoneal dialysis. His peritoneal dialysis is no longer effective. He was recently converted back to hemodialysis. His left forearm graft is chronically occluded.  He is currently dialyzing via a catheter Tuesday Thursday Saturday in [place].  Dr. Fausto Skillern is his nephrologist.  Other chronic medical problems include hypertension, prior stroke, diabetes, chronic right arm this use from prior fracture all of these are currently stable.  Past Medical History  Diagnosis Date  . Hypertension   . Stroke   . CHF (congestive heart failure)   . Peripheral vascular disease   . Renal disease   . High cholesterol   . GERD (gastroesophageal reflux disease)   . Type II diabetes mellitus   . Arthritis     "all over"  . Chronic lower back pain   . Gout     Past Surgical History  Procedure Laterality Date  . Cholecystectomy    . Arteriovenous graft placement Left 02/2011    [name] 03/12/2011  . Peritoneal catheter insertion      [name] 06/13/2014  . Humerus fracture surgery Right   . Insertion of dialysis catheter Right 06/15/2014    Procedure: INSERTION OF DIALYSIS CATHETER;  Surgeon: Larina Earthly, MD;  Location[facility];  Service: Vascular;  Laterality: Right;  Internal Jugular     Social History History  Substance Use Topics  . Smoking status: Former Smoker    Quit date: 07/18/1974  . Smokeless tobacco: Never Used  . Alcohol Use: No    Family History No family history on file.  Allergies  Allergies  Allergen Reactions  . Hydrocodone     unknown  . Penicillins     unknown     Current Outpatient Prescriptions  Medication Sig Dispense Refill  .  allopurinol (ZYLOPRIM) 100 MG tablet Take 100 mg by mouth daily.      Marland Kitchen amLODipine (NORVASC) 10 MG tablet Take 1 tablet (10 mg total) by mouth daily.  30 tablet  1  . aspirin EC 81 MG tablet Take 81 mg by mouth daily.      Marland Kitchen atorvastatin (LIPITOR) 20 MG tablet Take 1 tablet (20 mg total) by mouth daily at 6 PM.  30 tablet  1  . calcitRIOL (ROCALTROL) 0.5 MCG capsule Take 0.5 mcg by mouth daily.      . carvedilol (COREG) 25 MG tablet Take 25 mg by mouth 2 (two) times daily with a meal.      . clopidogrel (PLAVIX) 75 MG tablet Take 75 mg by mouth daily.      . folic acid-vitamin b complex-vitamin c-selenium-zinc (DIALYVITE) 3 MG TABS tablet Take 1 tablet by mouth daily.      . furosemide (LASIX) 40 MG tablet Take 120 mg by mouth daily.      Marland Kitchen loratadine (CLARITIN) 10 MG tablet Take 10 mg by mouth daily.      . nitroGLYCERIN (NITRODUR - DOSED IN MG/24 HR) 0.2 mg/hr patch Place 0.2 mg onto the skin daily.      Marland Kitchen omeprazole (PRILOSEC) 40 MG capsule Take 40 mg by mouth daily.      . potassium chloride 20 MEQ/15ML (10%) solution Take  30 mEq by mouth daily.      . sodium bicarbonate 650 MG tablet Take 1,300 mg by mouth 2 (two) times daily.      . sorbitol 70 % solution Take 30 mLs by mouth daily as needed (constipation).      Marland Kitchen tiZANidine (ZANAFLEX) 2 MG tablet Take 2 mg by mouth at bedtime as needed for muscle spasms.      . traMADol (ULTRAM) 50 MG tablet Take 50 mg by mouth every 12 (twelve) hours as needed for moderate pain.      Marland Kitchen zolpidem (AMBIEN) 5 MG tablet Take 2.5 mg by mouth at bedtime as needed for sleep.       No current facility-administered medications for this visit.    ROS:   General:  No weight loss, Fever, chills  HEENT: No recent headaches, no nasal bleeding, no visual changes, no sore throat  Neurologic: No dizziness, blackouts, seizures. No recent symptoms of stroke or mini- stroke. No recent episodes of slurred speech, or temporary blindness.  Cardiac: No recent episodes  of chest pain/pressure, no shortness of breath at rest.  No shortness of breath with exertion.  Denies history of atrial fibrillation or irregular heartbeat  Vascular: No history of rest pain in feet.  No history of claudication.  No history of non-healing ulcer, No history of DVT   Pulmonary: No home oxygen, no productive cough, no hemoptysis,  No asthma or wheezing  Musculoskeletal:   Arthritis,  Low back pain,   Joint pain  Hematologic:No history of hypercoagulable state.  No history of easy bleeding.  No history of anemia  Gastrointestinal: No hematochezia or melena,  No gastroesophageal reflux, no trouble swallowing  Urinary:  chronic Kidney disease, [x ] on HD -  MWF or [x ] TTHS,  Burning with urination,  Frequent urination,  Difficulty urinating;   Skin: No rashes  Psychological: No history of anxiety,  No history of depression   Physical Examination  Filed Vitals:   07/18/14 1115  BP: 156/77  Pulse: 71  Resp: 16  Height:  (1.727 m)  Weight: 216 lb (97.977 kg)    Body mass index is 32.85 kg/(m^2).  General:  Alert and oriented, no acute distress HEENT: Normal Neck: No bruit or JVD Pulmonary: Clear to auscultation bilaterally Cardiac: Regular Rate and Rhythm without murmur Gastrointestinal: Soft, non-tender, non-distended, no mass Skin: No rash Extremity Pulses:  2+ radial, brachial pulses bilaterally, occluded left forearm AV graft, right upper extremity was scars from prior humerus fracture right shoulder and elbow have slight contractures. I am able to extend most of his arm passively. Musculoskeletal: No deformity other than above or edema  Neurologic: Upper and lower extremity motor 5/5 and symmetric  DATA: Vein mapping ultrasound today shows no suitable veins for a fistula. I reviewed and interpreted this study.   ASSESSMENT: Patient needs long-term hemodialysis access. He is not really able to use his right arm. He would prefer  to have access in the right arm rather than the left arm. I believe with general anesthesia which revealed extend his arm enough to place a right forearm or most likely upper arm graft.  Risks benefits possible complications and procedure details were explained to the patient today including but limited to bleeding infection ischemic steal graft thrombosis he understands and agrees to proceed. Procedure scheduled for next week with general anesthesia   PLAN:    Fabienne Bruns, MD [facility] [place] Office: [phone] Pager: [phone]

## 2014-07-31 NOTE — Progress Notes (Signed)
Pt up in [facility], says arm " feels better".

## 2014-08-02 ENCOUNTER — Encounter (HOSPITAL_COMMUNITY): Payer: Self-pay | Admitting: Vascular Surgery

## 2014-08-02 LAB — POCT I-STAT 4, (NA,K, GLUC, HGB,HCT)
Glucose, Bld: 102 mg/dL — ABNORMAL HIGH (ref 70–99)
HEMATOCRIT: 29 % — AB (ref 39.0–52.0)
Hemoglobin: 9.9 g/dL — ABNORMAL LOW (ref 13.0–17.0)
POTASSIUM: 3.9 meq/L (ref 3.7–5.3)
SODIUM: 140 meq/L (ref 137–147)

## 2014-08-27 ENCOUNTER — Encounter (HOSPITAL_COMMUNITY): Payer: Self-pay | Admitting: *Deleted

## 2014-08-27 ENCOUNTER — Inpatient Hospital Stay (HOSPITAL_COMMUNITY)
Admission: AD | Admit: 2014-08-27 | Discharge: 2014-09-05 | DRG: 853 | Disposition: A | Discharge status: Skilled Nursing Facility | Payer: Medicare Other | Source: Other Acute Inpatient Hospital | Attending: Internal Medicine | Admitting: Internal Medicine

## 2014-08-27 DIAGNOSIS — I120 Hypertensive chronic kidney disease with stage 5 chronic kidney disease or end stage renal disease: Secondary | ICD-10-CM | POA: Diagnosis present

## 2014-08-27 DIAGNOSIS — N2581 Secondary hyperparathyroidism of renal origin: Secondary | ICD-10-CM | POA: Diagnosis present

## 2014-08-27 DIAGNOSIS — A419 Sepsis, unspecified organism: Secondary | ICD-10-CM | POA: Diagnosis not present

## 2014-08-27 DIAGNOSIS — K2100 Gastro-esophageal reflux disease with esophagitis, without bleeding: Secondary | ICD-10-CM

## 2014-08-27 DIAGNOSIS — Z87891 Personal history of nicotine dependence: Secondary | ICD-10-CM | POA: Diagnosis not present

## 2014-08-27 DIAGNOSIS — H9190 Unspecified hearing loss, unspecified ear: Secondary | ICD-10-CM | POA: Diagnosis not present

## 2014-08-27 DIAGNOSIS — M1990 Unspecified osteoarthritis, unspecified site: Secondary | ICD-10-CM | POA: Diagnosis present

## 2014-08-27 DIAGNOSIS — L03113 Cellulitis of right upper limb: Secondary | ICD-10-CM

## 2014-08-27 DIAGNOSIS — D631 Anemia in chronic kidney disease: Secondary | ICD-10-CM | POA: Diagnosis not present

## 2014-08-27 DIAGNOSIS — I10 Essential (primary) hypertension: Secondary | ICD-10-CM | POA: Diagnosis not present

## 2014-08-27 DIAGNOSIS — R61 Generalized hyperhidrosis: Secondary | ICD-10-CM | POA: Diagnosis present

## 2014-08-27 DIAGNOSIS — Z992 Dependence on renal dialysis: Secondary | ICD-10-CM

## 2014-08-27 DIAGNOSIS — Y832 Surgical operation with anastomosis, bypass or graft as the cause of abnormal reaction of the patient, or of later complication, without mention of misadventure at the time of the procedure: Secondary | ICD-10-CM | POA: Diagnosis not present

## 2014-08-27 DIAGNOSIS — M79621 Pain in right upper arm: Secondary | ICD-10-CM | POA: Diagnosis present

## 2014-08-27 DIAGNOSIS — Z7982 Long term (current) use of aspirin: Secondary | ICD-10-CM

## 2014-08-27 DIAGNOSIS — E1122 Type 2 diabetes mellitus with diabetic chronic kidney disease: Secondary | ICD-10-CM

## 2014-08-27 DIAGNOSIS — M109 Gout, unspecified: Secondary | ICD-10-CM | POA: Diagnosis present

## 2014-08-27 DIAGNOSIS — J449 Chronic obstructive pulmonary disease, unspecified: Secondary | ICD-10-CM | POA: Diagnosis present

## 2014-08-27 DIAGNOSIS — I739 Peripheral vascular disease, unspecified: Secondary | ICD-10-CM | POA: Diagnosis not present

## 2014-08-27 DIAGNOSIS — E119 Type 2 diabetes mellitus without complications: Secondary | ICD-10-CM | POA: Diagnosis present

## 2014-08-27 DIAGNOSIS — A498 Other bacterial infections of unspecified site: Secondary | ICD-10-CM

## 2014-08-27 DIAGNOSIS — N186 End stage renal disease: Secondary | ICD-10-CM | POA: Diagnosis present

## 2014-08-27 DIAGNOSIS — Z8673 Personal history of transient ischemic attack (TIA), and cerebral infarction without residual deficits: Secondary | ICD-10-CM

## 2014-08-27 DIAGNOSIS — N289 Disorder of kidney and ureter, unspecified: Secondary | ICD-10-CM

## 2014-08-27 DIAGNOSIS — L0390 Cellulitis, unspecified: Secondary | ICD-10-CM | POA: Diagnosis present

## 2014-08-27 DIAGNOSIS — Z823 Family history of stroke: Secondary | ICD-10-CM

## 2014-08-27 DIAGNOSIS — E785 Hyperlipidemia, unspecified: Secondary | ICD-10-CM | POA: Diagnosis not present

## 2014-08-27 DIAGNOSIS — G4730 Sleep apnea, unspecified: Secondary | ICD-10-CM | POA: Diagnosis not present

## 2014-08-27 DIAGNOSIS — R7881 Bacteremia: Secondary | ICD-10-CM | POA: Diagnosis not present

## 2014-08-27 DIAGNOSIS — A4181 Sepsis due to Enterococcus: Principal | ICD-10-CM | POA: Diagnosis present

## 2014-08-27 DIAGNOSIS — B964 Proteus (mirabilis) (morganii) as the cause of diseases classified elsewhere: Secondary | ICD-10-CM | POA: Diagnosis not present

## 2014-08-27 DIAGNOSIS — D649 Anemia, unspecified: Secondary | ICD-10-CM | POA: Diagnosis not present

## 2014-08-27 DIAGNOSIS — T827XXD Infection and inflammatory reaction due to other cardiac and vascular devices, implants and grafts, subsequent encounter: Secondary | ICD-10-CM

## 2014-08-27 DIAGNOSIS — Z794 Long term (current) use of insulin: Secondary | ICD-10-CM | POA: Diagnosis not present

## 2014-08-27 DIAGNOSIS — K219 Gastro-esophageal reflux disease without esophagitis: Secondary | ICD-10-CM | POA: Diagnosis not present

## 2014-08-27 DIAGNOSIS — I509 Heart failure, unspecified: Secondary | ICD-10-CM | POA: Diagnosis present

## 2014-08-27 DIAGNOSIS — Z880 Allergy status to penicillin: Secondary | ICD-10-CM

## 2014-08-27 DIAGNOSIS — B952 Enterococcus as the cause of diseases classified elsewhere: Secondary | ICD-10-CM

## 2014-08-27 DIAGNOSIS — E1121 Type 2 diabetes mellitus with diabetic nephropathy: Secondary | ICD-10-CM

## 2014-08-27 LAB — COMPREHENSIVE METABOLIC PANEL
ALBUMIN: 2 g/dL — AB (ref 3.5–5.2)
ALT: 27 U/L (ref 0–53)
ANION GAP: 14 (ref 5–15)
AST: 41 U/L — ABNORMAL HIGH (ref 0–37)
Alkaline Phosphatase: 332 U/L — ABNORMAL HIGH (ref 39–117)
BUN: 33 mg/dL — AB (ref 6–23)
CO2: 29 mEq/L (ref 19–32)
CREATININE: 4.45 mg/dL — AB (ref 0.50–1.35)
Calcium: 8.4 mg/dL (ref 8.4–10.5)
Chloride: 93 mEq/L — ABNORMAL LOW (ref 96–112)
GFR calc Af Amer: 13 mL/min — ABNORMAL LOW (ref >90)
GFR, EST NON AFRICAN AMERICAN: 12 mL/min — AB (ref >90)
Glucose, Bld: 86 mg/dL (ref 70–99)
Potassium: 4.6 mEq/L (ref 3.7–5.3)
Sodium: 136 mEq/L — ABNORMAL LOW (ref 137–147)
Total Bilirubin: 1.1 mg/dL (ref 0.3–1.2)
Total Protein: 7.7 g/dL (ref 6.0–8.3)

## 2014-08-27 LAB — CBC WITH DIFFERENTIAL/PLATELET
Basophils Absolute: 0 10*3/uL (ref 0.0–0.1)
Basophils Relative: 0 % (ref 0–1)
Eosinophils Absolute: 0.2 10*3/uL (ref 0.0–0.7)
Eosinophils Relative: 2 % (ref 0–5)
HCT: 23.3 % — ABNORMAL LOW (ref 39.0–52.0)
Hemoglobin: 7.9 g/dL — ABNORMAL LOW (ref 13.0–17.0)
LYMPHS PCT: 17 % (ref 12–46)
Lymphs Abs: 1.7 10*3/uL (ref 0.7–4.0)
MCH: 30.3 pg (ref 26.0–34.0)
MCHC: 33.9 g/dL (ref 30.0–36.0)
MCV: 89.3 fL (ref 78.0–100.0)
MONOS PCT: 9 % (ref 3–12)
Monocytes Absolute: 0.9 10*3/uL (ref 0.1–1.0)
NEUTROS PCT: 72 % (ref 43–77)
Neutro Abs: 7 10*3/uL (ref 1.7–7.7)
PLATELETS: ADEQUATE 10*3/uL (ref 150–400)
RBC: 2.61 MIL/uL — ABNORMAL LOW (ref 4.22–5.81)
RDW: 14.6 % (ref 11.5–15.5)
WBC: 9.8 10*3/uL (ref 4.0–10.5)

## 2014-08-27 LAB — GLUCOSE, CAPILLARY: Glucose-Capillary: 95 mg/dL (ref 70–99)

## 2014-08-27 MED ORDER — SORBITOL 70 % SOLN: 30.0000 mL | Freq: Every day | Status: DC | PRN

## 2014-08-27 MED ORDER — ATORVASTATIN CALCIUM 20 MG PO TABS
20.0000 mg | ORAL_TABLET | Freq: Every day | ORAL | Status: DC
  Administered 2014-08-28 – 2014-09-04 (×8): 20 mg via ORAL
  Filled 2014-08-27 (×9): qty 1

## 2014-08-27 MED ORDER — METOCLOPRAMIDE HCL 5 MG PO TABS
5.0000 mg | ORAL_TABLET | Freq: Two times a day (BID) | ORAL | Status: DC
  Administered 2014-08-27 – 2014-09-02 (×12): 5 mg via ORAL
  Filled 2014-08-27 (×16): qty 1

## 2014-08-27 MED ORDER — ALLOPURINOL 100 MG PO TABS
100.0000 mg | ORAL_TABLET | Freq: Every day | ORAL | Status: DC
  Administered 2014-08-28 – 2014-09-05 (×9): 100 mg via ORAL
  Filled 2014-08-27 (×9): qty 1

## 2014-08-27 MED ORDER — ZOLPIDEM TARTRATE 5 MG PO TABS: 2.5000 mg | ORAL_TABLET | Freq: Every evening | ORAL | Status: DC | PRN

## 2014-08-27 MED ORDER — DIALYVITE 3000 3 MG PO TABS: 1.0000 | ORAL_TABLET | Freq: Every day | ORAL | Status: DC

## 2014-08-27 MED ORDER — INSULIN ASPART 100 UNIT/ML ~~LOC~~ SOLN
0.0000 [IU] | Freq: Three times a day (TID) | SUBCUTANEOUS | Status: DC
  Administered 2014-08-30 (×2): 2 [IU] via SUBCUTANEOUS

## 2014-08-27 MED ORDER — CLOPIDOGREL BISULFATE 75 MG PO TABS
75.0000 mg | ORAL_TABLET | Freq: Every day | ORAL | Status: DC
  Administered 2014-08-29 – 2014-09-05 (×8): 75 mg via ORAL
  Filled 2014-08-27 (×9): qty 1

## 2014-08-27 MED ORDER — RENA-VITE PO TABS
1.0000 | ORAL_TABLET | Freq: Every day | ORAL | Status: DC
  Administered 2014-08-27 – 2014-09-04 (×9): 1 via ORAL
  Filled 2014-08-27 (×12): qty 1

## 2014-08-27 MED ORDER — PANTOPRAZOLE SODIUM 40 MG PO TBEC
40.0000 mg | DELAYED_RELEASE_TABLET | Freq: Every day | ORAL | Status: DC
  Administered 2014-08-29 – 2014-09-05 (×8): 40 mg via ORAL
  Filled 2014-08-27 (×8): qty 1

## 2014-08-27 MED ORDER — SODIUM BICARBONATE 650 MG PO TABS
1300.0000 mg | ORAL_TABLET | Freq: Two times a day (BID) | ORAL | Status: DC
  Administered 2014-08-27 – 2014-09-05 (×17): 1300 mg via ORAL
  Filled 2014-08-27 (×19): qty 2

## 2014-08-27 MED ORDER — NITROGLYCERIN 0.4 MG/HR TD PT24
0.4000 mg | MEDICATED_PATCH | Freq: Every day | TRANSDERMAL | Status: DC
  Administered 2014-08-28 – 2014-09-05 (×9): 0.4 mg via TRANSDERMAL
  Filled 2014-08-27 (×9): qty 1

## 2014-08-27 MED ORDER — CARVEDILOL 12.5 MG PO TABS
12.5000 mg | ORAL_TABLET | Freq: Two times a day (BID) | ORAL | Status: DC
Start: 2014-08-28 — End: 2014-09-05
  Administered 2014-08-28 – 2014-09-05 (×13): 12.5 mg via ORAL
  Filled 2014-08-27 (×19): qty 1

## 2014-08-27 MED ORDER — SUCRALFATE 1 G PO TABS
1.0000 g | ORAL_TABLET | Freq: Three times a day (TID) | ORAL | Status: DC
  Administered 2014-08-27 – 2014-09-05 (×23): 1 g via ORAL
  Filled 2014-08-27 (×28): qty 1

## 2014-08-27 MED ORDER — CALCITRIOL 0.5 MCG PO CAPS
0.5000 ug | ORAL_CAPSULE | Freq: Every day | ORAL | Status: DC
  Filled 2014-08-27: qty 1

## 2014-08-27 MED ORDER — TRAMADOL HCL 50 MG PO TABS
50.0000 mg | ORAL_TABLET | Freq: Four times a day (QID) | ORAL | Status: DC | PRN
  Administered 2014-09-01: 50 mg via ORAL
  Filled 2014-08-27: qty 1

## 2014-08-27 MED ORDER — ACETAMINOPHEN 325 MG PO TABS
650.0000 mg | ORAL_TABLET | ORAL | Status: DC | PRN
  Administered 2014-08-27 – 2014-09-05 (×4): 650 mg via ORAL
  Filled 2014-08-27 (×4): qty 2

## 2014-08-27 MED ORDER — INSULIN ASPART PROT & ASPART (70-30 MIX) 100 UNIT/ML ~~LOC~~ SUSP
15.0000 [IU] | Freq: Two times a day (BID) | SUBCUTANEOUS | Status: DC
  Administered 2014-08-30 (×2): 15 [IU] via SUBCUTANEOUS
  Filled 2014-08-27: qty 10

## 2014-08-27 MED ORDER — LORATADINE 10 MG PO TABS
10.0000 mg | ORAL_TABLET | Freq: Every day | ORAL | Status: DC | PRN
  Filled 2014-08-27: qty 1

## 2014-08-27 MED ORDER — SORBITOL 70 % PO SOLN
30.0000 mL | Freq: Every day | ORAL | Status: DC | PRN
  Filled 2014-08-27: qty 30

## 2014-08-27 MED ORDER — VANCOMYCIN HCL IN DEXTROSE 1-5 GM/200ML-% IV SOLN
1000.0000 mg | Freq: Once | INTRAVENOUS | Status: AC
  Administered 2014-08-27: 1000 mg via INTRAVENOUS
  Filled 2014-08-27: qty 200

## 2014-08-27 MED ORDER — ASPIRIN EC 81 MG PO TBEC
81.0000 mg | DELAYED_RELEASE_TABLET | Freq: Every day | ORAL | Status: DC
  Administered 2014-08-29 – 2014-09-05 (×8): 81 mg via ORAL
  Filled 2014-08-27 (×9): qty 1

## 2014-08-27 MED ORDER — HEPARIN SODIUM (PORCINE) 5000 UNIT/ML IJ SOLN
5000.0000 [IU] | Freq: Three times a day (TID) | INTRAMUSCULAR | Status: DC
  Administered 2014-08-27 – 2014-08-29 (×3): 5000 [IU] via SUBCUTANEOUS
  Filled 2014-08-27 (×7): qty 1

## 2014-08-27 MED ORDER — AMLODIPINE BESYLATE 10 MG PO TABS
10.0000 mg | ORAL_TABLET | Freq: Every day | ORAL | Status: DC
  Administered 2014-08-30 – 2014-09-05 (×7): 10 mg via ORAL
  Filled 2014-08-27 (×9): qty 1

## 2014-08-27 MED ORDER — NITROGLYCERIN 0.4 MG SL SUBL: 0.4000 mg | SUBLINGUAL_TABLET | SUBLINGUAL | Status: DC | PRN

## 2014-08-27 NOTE — Consult Note (Signed)
Patient name: [name] MRN: [id number] DOB: 14-Dec-1933 Sex: male   Referred by: [facility]  Reason for referral: No chief complaint on file.   HISTORY OF PRESENT ILLNESS: The patient was transferred this evening from [facility]. Apparently he was admitted there for 4 days with erythema around his right upper arm graft. He is being dialyzed currently being and right IJ tunneled catheter. He reports that he had dialysis this morning. Appears to be somewhat of a poor historian. Did have placement of a new upper arm loop graft with Dr. Darrick Pennafields: 07/31/2014. Apparently continued to have sepsis and was transferred to [facility] for further treatment.  Past Medical History  Diagnosis Date  . Hypertension   . CHF (congestive heart failure)   . Peripheral vascular disease   . Renal disease   . High cholesterol   . GERD (gastroesophageal reflux disease)   . Type II diabetes mellitus   . Arthritis     "all over"  . Chronic lower back pain   . Gout   . Shortness of breath   . Sleep apnea     does not  use cpap  . Stroke     family member states he's not had a stroke    Past Surgical History  Procedure Laterality Date  . Cholecystectomy    . Arteriovenous graft placement Left 02/2011    [name]/notes 03/12/2011  . Peritoneal catheter insertion      [name]/notes 06/13/2014  . Humerus fracture surgery Right   . Insertion of dialysis catheter Right 06/15/2014    Procedure: INSERTION OF DIALYSIS CATHETER;  Surgeon: Larina Earthlyodd F Rick Warnick, MD;  [facility];  Service: Vascular;  Laterality: Right;  Internal Jugular  . Colonoscopy    . Eye surgery Bilateral     cataract surgery  . Av fistula placement Right 07/31/2014    Procedure: INSERTION OF ARTERIOVENOUS (AV) GORE-TEX GRAFT Right Upper  ARM;  Surgeon: Sherren Kernsharles E Fields, MD;  [facility];  Service: Vascular;  Laterality: Right;    History   Social History  . Marital Status: Divorced    Spouse Name: N/A    Number of Children: N/A   . Years of Education: N/A   Occupational History  . Not on file.   Social History Main Topics  . Smoking status: Former Smoker    Quit date: 07/18/1974  . Smokeless tobacco: Never Used  . Alcohol Use: No  . Drug Use: No  . Sexual Activity: No   Other Topics Concern  . Not on file   Social History Narrative  . No narrative on file    No family history on file.  Allergies as of 08/27/2014 - Review Complete 08/27/2014  Allergen Reaction Noted  . Hydrocodone Other (See Comments) 06/13/2014  . Penicillins Other (See Comments) 06/13/2014    No current facility-administered medications on file prior to encounter.   Current Outpatient Prescriptions on File Prior to Encounter  Medication Sig Dispense Refill  . allopurinol (ZYLOPRIM) 100 MG tablet Take 100 mg by mouth daily.      Marland Kitchen. amLODipine (NORVASC) 10 MG tablet Take 10 mg by mouth daily.      Marland Kitchen. aspirin EC 81 MG tablet Take 81 mg by mouth daily.      Marland Kitchen. atorvastatin (LIPITOR) 20 MG tablet Take 20 mg by mouth daily at 6 PM.      . calcitRIOL (ROCALTROL) 0.5 MCG capsule Take 0.5 mcg by mouth daily.      .Marland Kitchen  carvedilol (COREG) 12.5 MG tablet Take 12.5 mg by mouth 2 (two) times daily with a meal.      . clopidogrel (PLAVIX) 75 MG tablet Take 75 mg by mouth daily.      . folic acid-vitamin b complex-vitamin c-selenium-zinc (DIALYVITE) 3 MG TABS tablet Take 1 tablet by mouth daily.      . furosemide (LASIX) 40 MG tablet Take 120 mg by mouth daily.      Marland Kitchen glipiZIDE (GLUCOTROL XL) 10 MG 24 hr tablet Take 10 mg by mouth daily with breakfast.      . insulin lispro protamine-lispro (HUMALOG 75/25 MIX) (75-25) 100 UNIT/ML SUSP injection Inject 20 Units into the skin 2 (two) times daily.      Marland Kitchen loratadine (CLARITIN) 10 MG tablet Take 10 mg by mouth daily as needed for allergies.       Marland Kitchen metoCLOPramide (REGLAN) 5 MG tablet Take 5 mg by mouth 2 (two) times daily.      . nitroGLYCERIN (NITRODUR - DOSED IN MG/24 HR) 0.4 mg/hr patch Place 0.4 mg  onto the skin daily.      . nitroGLYCERIN (NITROSTAT) 0.4 MG SL tablet Place 0.4 mg under the tongue every 5 (five) minutes as needed for chest pain.      Marland Kitchen omeprazole (PRILOSEC) 40 MG capsule Take 40 mg by mouth daily.      . potassium chloride 20 MEQ/15ML (10%) solution Take 20 mEq by mouth daily.       . sodium bicarbonate 650 MG tablet Take 1,300 mg by mouth 2 (two) times daily.      . sorbitol 70 % solution Take 30 mLs by mouth daily as needed (constipation).      . sucralfate (CARAFATE) 1 G tablet Take 1 g by mouth 3 (three) times daily.      . traMADol (ULTRAM) 50 MG tablet Take 1 tablet (50 mg total) by mouth every 6 (six) hours as needed.  20 tablet  0  . zolpidem (AMBIEN) 5 MG tablet Take 2.5 mg by mouth at bedtime as needed for sleep.          PHYSICAL EXAMINATION:  General: The patient is a well-nourished male, in no acute distress. During a great deal coughing. No respiratory distress  Vital signs are There were no vitals taken for this visit. Pulmonary: There is a good air exchange  Musculoskeletal:difficulty moving his right shoulder Neurologic: No focal weakness or paresthesias are detected, Skin: Erythema over graft. Nol fluctuance. Tender over his graft as well. No drainage from upper arm incisions Psychiatric: The patient has normal affect. Cardiovascular: Palpable right radial pulse No thrill in his right upper arm AV graft    Impression and Plan:  Labs pending currently. Apparent continued cellulitis around the new right upper arm graft despite 4 days of antibiotics to more hospital. The graft is occluded by physical exam with no thrill present. Discussed with patient. Recommend removal of the graft do 2 persistent erythema a nonfunctional state of graft. We'll make n.p.o. Reassess in the morning to determine if he is able to undergo surgery tomorrow    Devonia Farro [facility] [place] Office: [phone]

## 2014-08-27 NOTE — H&P (Addendum)
[facility] History and Physical  Savio Albrecht ZOX:[id number] DOB: 02/02/1934 DOA: 08/27/2014  Referring physician: ED physician PCP: Lisette Abu, MD  Specialists:   Chief Complaint: right upper arm pain, sweating, fever   HPI: [name] is a 80 y.o. male with past medical history of COPD, type 2 diabetes, hypertension, end-stage renal disease on hemodialysis, history of TIA, hyperlipidemia, congestive heart failure, who presents with right upper arm pain, sweating and fever.  The patient was admitted to the [facility] hospital because of right arm swelling, tenderness and fever on 08/21/14. He was diagnosed with right arm cellulitis which is most likely secondary to fistula placement (he had AV graft placed over R upper arm by Dr. Jettie Booze on 9/8). He was found to have positive blood culture with enterococcus and Proteus mirabilis which are sensitive to vancomycin and meropenem.  The patient was treated with IV vancomycin and meropenem with some clinical improvement in [facility]. Because patient needs vascular surgeon's evaluation and treatment, he is therefore transferred to [facility] for further evaluation and treatment. When I evaluated pt on the floor, patient has fever with temperature 101.3, and swelling and tender right upper arm over fistular placement area. His speech is not clear. He mumbles in answering my questions. I am not sure whether this is new issue or chronic issue.   Review of Systems: As presented in the history of presenting illness, rest negative.  Where does patient live?  Lives with his son at home Can patient participate in ADLs? Yes  Allergy:  Allergies  Allergen Reactions  . Hydrocodone Other (See Comments)    Upset stomach  . Penicillins Other (See Comments)    Upset stomach    Past Medical History  Diagnosis Date  . Hypertension   . CHF (congestive heart failure)   . Peripheral vascular disease   . Renal disease   . High cholesterol    . GERD (gastroesophageal reflux disease)   . Type II diabetes mellitus   . Arthritis     "all over"  . Chronic lower back pain   . Gout   . Shortness of breath   . Sleep apnea     does not  use cpap  . Stroke     family member states he's not had a stroke    Past Surgical History  Procedure Laterality Date  . Cholecystectomy    . Arteriovenous graft placement Left 02/2011    [name] 03/12/2011  . Peritoneal catheter insertion      [name] 06/13/2014  . Humerus fracture surgery Right   . Insertion of dialysis catheter Right 06/15/2014    Procedure: INSERTION OF DIALYSIS CATHETER;  Surgeon: Larina Earthly, MD;  Location: [facility] OR;  Service: Vascular;  Laterality: Right;  Internal Jugular  . Colonoscopy    . Eye surgery Bilateral     cataract surgery  . Av fistula placement Right 07/31/2014    Procedure: INSERTION OF ARTERIOVENOUS (AV) GORE-TEX GRAFT Right Upper  ARM;  Surgeon: Sherren Kerns, MD;  Location: [facility] OR;  Service: Vascular;  Laterality: Right;    Social History:  reports that he quit smoking about 40 years ago. He has never used smokeless tobacco. He reports that he does not drink alcohol or use illicit drugs.  Family History:  Family History  Problem Relation Age of Onset  . Stroke Mother   . Hypertension Father      Prior to Admission medications   Medication Sig Start Date End Date  Taking? Authorizing Provider  allopurinol (ZYLOPRIM) 100 MG tablet Take 100 mg by mouth daily.   Yes Historical Provider, MD  amLODipine (NORVASC) 10 MG tablet Take 10 mg by mouth daily.   Yes Historical Provider, MD  aspirin EC 81 MG tablet Take 81 mg by mouth daily.   Yes Historical Provider, MD  atorvastatin (LIPITOR) 20 MG tablet Take 20 mg by mouth daily at 6 PM.   Yes Historical Provider, MD  calcitRIOL (ROCALTROL) 0.5 MCG capsule Take 0.5 mcg by mouth daily.   Yes Historical Provider, MD  carvedilol (COREG) 12.5 MG tablet Take 12.5 mg by mouth 2 (two) times daily with a meal.   Yes  Historical Provider, MD  clopidogrel (PLAVIX) 75 MG tablet Take 75 mg by mouth daily.   Yes Historical Provider, MD  folic acid-vitamin b complex-vitamin c-selenium-zinc (DIALYVITE) 3 MG TABS tablet Take 1 tablet by mouth daily.   Yes Historical Provider, MD  furosemide (LASIX) 40 MG tablet Take 120 mg by mouth daily.   Yes Historical Provider, MD  glipiZIDE (GLUCOTROL XL) 10 MG 24 hr tablet Take 10 mg by mouth daily with breakfast.   Yes Historical Provider, MD  insulin lispro protamine-lispro (HUMALOG 75/25 MIX) (75-25) 100 UNIT/ML SUSP injection Inject 20 Units into the skin 2 (two) times daily.   Yes Historical Provider, MD  loratadine (CLARITIN) 10 MG tablet Take 10 mg by mouth daily as needed for allergies.    Yes Historical Provider, MD  metoCLOPramide (REGLAN) 5 MG tablet Take 5 mg by mouth 2 (two) times daily.   Yes Historical Provider, MD  nitroGLYCERIN (NITRODUR - DOSED IN MG/24 HR) 0.4 mg/hr patch Place 0.4 mg onto the skin daily.   Yes Historical Provider, MD  nitroGLYCERIN (NITROSTAT) 0.4 MG SL tablet Place 0.4 mg under the tongue every 5 (five) minutes as needed for chest pain.   Yes Historical Provider, MD  omeprazole (PRILOSEC) 40 MG capsule Take 40 mg by mouth daily.   Yes Historical Provider, MD  potassium chloride 20 MEQ/15ML (10%) solution Take 20 mEq by mouth daily.    Yes Historical Provider, MD  sodium bicarbonate 650 MG tablet Take 1,300 mg by mouth 2 (two) times daily.   Yes Historical Provider, MD  sorbitol 70 % solution Take 30 mLs by mouth daily as needed (constipation).   Yes Historical Provider, MD  sucralfate (CARAFATE) 1 G tablet Take 1 g by mouth 3 (three) times daily.   Yes Historical Provider, MD  traMADol (ULTRAM) 50 MG tablet Take 1 tablet (50 mg total) by mouth every 6 (six) hours as needed. 07/31/14  Yes Sherren Kerns, MD  zolpidem (AMBIEN) 5 MG tablet Take 2.5 mg by mouth at bedtime as needed for sleep.   Yes Historical Provider, MD    Physical  Exam: Filed Vitals:   08/27/14 2055 08/27/14 2100 08/28/14 0203  BP: 175/76  132/51  Pulse: 86  76  Temp: 101.3 F (38.5 C)  98.9 F (37.2 C)  TempSrc:   Oral  Resp: 20  18  Height:  5\' 8"  (1.727 m)   Weight: 86.183 kg (190 lb)    SpO2: 94%  100%   General: Not in acute distress, dry mucous and membrane. HEENT:       Eyes: PERRL, EOMI, no scleral icterus       ENT: No discharge from the ears and nose, no pharynx injection, no tonsillar enlargement.        Neck: No JVD, no  bruit, no mass felt. Cardiac: S1/S2, RRR, No murmurs, gallops or rubs Pulm: Good air movement bilaterally. Clear to auscultation bilaterally. No rales, wheezing, rhonchi or rubs. Chest wall: there is dialysis catheter over his r upper chest which is clean, no signs of infection Abd: Soft, nondistended, nontender, no rebound pain, no organomegaly, BS present Ext: Has erythema, induration, swelling, tenderness over right upper arm.  2+DP/PT pulse bilaterally Musculoskeletal: No joint deformities, erythema, or stiffness, ROM full Skin: No rashes.  Neuro: Alert and oriented only to person, cranial nerves II-XII grossly intact, muscle strength 5/5 in all extremeties except for right arm which is 4/5 in muscle strength, sensation to light touch intact. Brachial reflex 2+ bilaterally. Knee reflex 1+ bilaterally. Negative Babinski's sign. Psych: Patient is not psychotic, no suicidal or hemocidal ideation.  Labs on Admission:  Basic Metabolic Panel:  Recent Labs Lab 08/27/14 2243  NA 136*  K 4.6  CL 93*  CO2 29  GLUCOSE 86  BUN 33*  CREATININE 4.45*  CALCIUM 8.4   Liver Function Tests:  Recent Labs Lab 08/27/14 2243  AST 41*  ALT 27  ALKPHOS 332*  BILITOT 1.1  PROT 7.7  ALBUMIN 2.0*   No results found for this basename: LIPASE, AMYLASE,  in the last 168 hours No results found for this basename: AMMONIA,  in the last 168 hours CBC:  Recent Labs Lab 08/27/14 2243  WBC 9.8  NEUTROABS 7.0  HGB  7.9*  HCT 23.3*  MCV 89.3  PLT PLATELET CLUMPS NOTED ON SMEAR, COUNT APPEARS ADEQUATE   Cardiac Enzymes: No results found for this basename: CKTOTAL, CKMB, CKMBINDEX, TROPONINI,  in the last 168 hours  BNP (last 3 results) No results found for this basename: PROBNP,  in the last 8760 hours CBG:  Recent Labs Lab 08/27/14 2053 08/28/14 0159  GLUCAP 95 79    Radiological Exams on Admission: No results found.  EKG: Independently reviewed.   Assessment/Plan Principal Problem:   Cellulitis Active Problems:   ESRD needing dialysis   DM2 (diabetes mellitus, type 2)   HTN (hypertension)   Sepsis   Hypertension   CHF (congestive heart failure)   Peripheral vascular disease   GERD (gastroesophageal reflux disease)   Gout   Bacteremia   History of stroke   1. Cellulitis in right arm, bacteremia, sepsis: most likely due to recent AV graft placemet infection. Per transfer not, patient seems to have had venous doppler for evaluation of his right arm which showed possible septic thrombophlebitis, but I could not find the report. Now patient is septic with bacteremia, with enterococcus and Proteus mirabilis, which are sensitive to vancomycin and meropenem. Per Dr. Kirby CriglerVega's e-mail, Vascular surgeon is aware of patient and should follow up patient.   - will admit to med-surg bed - continue IV vancomycin and meropenem - repeat blood culture given he is still febrile - follow up vascular surgeon's recs - lab: CMP, CBC with diff, INR, PTT - NPO in case he needs a procedure in AM  2. DM-II: on 70/30 insulin 20 U bid and glipizide at home - will d/c glipizide - decreased his 70/30 insulin to 15 U bid when he is on NPO. - SSI  3. ESRD-HD: T/T/S. He had full course HD on 08/27/14 per transfer note - Left message to renal   4. CHF: no  2d echo record in Epic. Patient is clinically dry on admission. He was on 120 mg Lasix daily at home. -will hold lasix now  -  watch volume status  carefully  5. HTN:  Blood pressure is 175/76 on admission.  - Will continue home medications, including amlodipine, Coreg - Hold Lasix as above  6. GERD: continue PPI  7. Right arm weakness and mumbling speech: not sure about his baseline.  His right arm weakness is most likely due to pain. I doubt that he has acute stroke. May be due to hx of stroke. His CT-head on 08/21/14 showed remote inferior right cerebellar infarct.  - need to observe patient carefully - if symptoms worsening, may consider imager of brain to r/o stroke.  -continue home Plavix and aspirin    DVT ppx: SQ Heparin    Code Status: Full code Family Communication: None at bed side.   Disposition Plan: Admit to inpatient  Lorretta Harp [facility] Pager [phone]  If 7PM-7AM, please contact night-coverage [email or web address] Password [email or web address] 08/28/2014, 4:42 AM

## 2014-08-27 NOTE — Progress Notes (Signed)
ANTIBIOTIC CONSULT NOTE - INITIAL  Pharmacy Consult for Vancomycin and Meropenem Indication: bacteremia and cellulitis; dialysis catheter-related  Allergies  Allergen Reactions  . Hydrocodone Other (See Comments)    Upset stomach  . Penicillins Other (See Comments)    Upset stomach    Patient Measurements: Height: 5\' 8"  (172.7 cm) Weight: 190 lb (86.183 kg) IBW/kg (Calculated) : 68.4  Vital Signs: Temp: 101.3 F (38.5 C) (10/05 2055) BP: 175/76 mmHg (10/05 2055) Pulse Rate: 86 (10/05 2055)  Labs:  - labs pending here.  - ESRD  - W[facility] 4.2 at [facility]  Medical History: Past Medical History  Diagnosis Date  . Hypertension   . CHF (congestive heart failure)   . Peripheral vascular disease   . Renal disease   . High cholesterol   . GERD (gastroesophageal reflux disease)   . Type II diabetes mellitus   . Arthritis     "all over"  . Chronic lower back pain   . Gout   . Shortness of breath   . Sleep apnea     does not  use cpap  . Stroke     family member states he's not had a stroke   Assessment:  [facility] with AMS after dialysis.  Had temp to 102.4 and swelling and erythema of right upper arm, area of dialysis graft.  [facility];   [facility] reported to have grown Enterococcus and Proteus. Has been on Vancomycin and Meropenem.  [facility] Database administratorN Administrative Coordinator [facility]  ([facility] Meropenem 1 gram IV given at ~4pm today.  No Vancomycin given today. Patient also believes that he only got 1 antibiotic today.  Last Vanc dose 10/2 after dialysis.  Blood cultures x 2 done tonight.   Dialysis graft may be removed tomorrow.  Goal of Therapy:  pre-dialysis Vancomycin levels 15-25 mcg/ml Appropriate Meropenem dose for renal function  Plan:   Vancomycin 1 gram IV x 1 tonight.  No Meropenem tonight.  Will follow up for  dialysis plans and schedule next antibiotic doses after next dialysis.   Plan Meropemen 500 mg IV and Vancomycin 1 gram IV.   Will follow up blood cultures.   Dennie FettersEgan, Nathanyel Defenbaugh Donovan, RPh Pager: [phone] 08/27/2014,10:40 PM

## 2014-08-28 ENCOUNTER — Encounter (HOSPITAL_COMMUNITY)
Admission: AD | Disposition: A | Discharge status: Skilled Nursing Facility | Payer: Self-pay | Source: Other Acute Inpatient Hospital | Attending: Internal Medicine

## 2014-08-28 ENCOUNTER — Encounter (HOSPITAL_COMMUNITY): Payer: Self-pay | Admitting: Internal Medicine

## 2014-08-28 ENCOUNTER — Encounter (HOSPITAL_COMMUNITY): Payer: Medicare Other | Admitting: Anesthesiology

## 2014-08-28 ENCOUNTER — Inpatient Hospital Stay (HOSPITAL_COMMUNITY): Payer: Medicare Other | Admitting: Anesthesiology

## 2014-08-28 DIAGNOSIS — I10 Essential (primary) hypertension: Secondary | ICD-10-CM

## 2014-08-28 DIAGNOSIS — T82898A Other specified complication of vascular prosthetic devices, implants and grafts, initial encounter: Secondary | ICD-10-CM

## 2014-08-28 DIAGNOSIS — Z8673 Personal history of transient ischemic attack (TIA), and cerebral infarction without residual deficits: Secondary | ICD-10-CM

## 2014-08-28 DIAGNOSIS — A419 Sepsis, unspecified organism: Secondary | ICD-10-CM

## 2014-08-28 DIAGNOSIS — N186 End stage renal disease: Secondary | ICD-10-CM

## 2014-08-28 DIAGNOSIS — R7881 Bacteremia: Secondary | ICD-10-CM | POA: Diagnosis present

## 2014-08-28 HISTORY — PX: AVGG REMOVAL: SHX5153

## 2014-08-28 LAB — BASIC METABOLIC PANEL
Anion gap: 14 (ref 5–15)
BUN: 39 mg/dL — AB (ref 6–23)
CHLORIDE: 92 meq/L — AB (ref 96–112)
CO2: 28 mEq/L (ref 19–32)
Calcium: 8.4 mg/dL (ref 8.4–10.5)
Creatinine, Ser: 4.92 mg/dL — ABNORMAL HIGH (ref 0.50–1.35)
GFR calc non Af Amer: 10 mL/min — ABNORMAL LOW (ref >90)
GFR, EST AFRICAN AMERICAN: 12 mL/min — AB (ref >90)
GLUCOSE: 71 mg/dL (ref 70–99)
POTASSIUM: 4.4 meq/L (ref 3.7–5.3)
Sodium: 134 mEq/L — ABNORMAL LOW (ref 137–147)

## 2014-08-28 LAB — CBC
HCT: 27.1 % — ABNORMAL LOW (ref 39.0–52.0)
Hemoglobin: 8.9 g/dL — ABNORMAL LOW (ref 13.0–17.0)
MCH: 29.6 pg (ref 26.0–34.0)
MCHC: 32.8 g/dL (ref 30.0–36.0)
MCV: 90 fL (ref 78.0–100.0)
Platelets: 190 10*3/uL (ref 150–400)
RBC: 3.01 MIL/uL — ABNORMAL LOW (ref 4.22–5.81)
RDW: 14.6 % (ref 11.5–15.5)
WBC: 8 10*3/uL (ref 4.0–10.5)

## 2014-08-28 LAB — GLUCOSE, CAPILLARY
GLUCOSE-CAPILLARY: 70 mg/dL (ref 70–99)
GLUCOSE-CAPILLARY: 76 mg/dL (ref 70–99)
GLUCOSE-CAPILLARY: 80 mg/dL (ref 70–99)
Glucose-Capillary: 79 mg/dL (ref 70–99)
Glucose-Capillary: 70 mg/dL (ref 70–99)
Glucose-Capillary: 74 mg/dL (ref 70–99)
Glucose-Capillary: 141 mg/dL — ABNORMAL HIGH (ref 70–99)
Glucose-Capillary: 106 mg/dL — ABNORMAL HIGH (ref 70–99)

## 2014-08-28 LAB — VANCOMYCIN, RANDOM: Vancomycin Rm: 23.4 ug/mL

## 2014-08-28 LAB — APTT: aPTT: 38 seconds — ABNORMAL HIGH (ref 24–37)

## 2014-08-28 LAB — PROTIME-INR
INR: 1.17 (ref 0.00–1.49)
Prothrombin Time: 14.9 seconds (ref 11.6–15.2)

## 2014-08-28 LAB — PHOSPHORUS: PHOSPHORUS: 4.1 mg/dL (ref 2.3–4.6)

## 2014-08-28 SURGERY — REMOVAL OF ARTERIOVENOUS GORETEX GRAFT (AVGG)
Anesthesia: General | Site: Arm Upper | Laterality: Right

## 2014-08-28 MED ORDER — SODIUM CHLORIDE 0.9 % IR SOLN
Status: DC | PRN
  Administered 2014-08-28: 11:00:00

## 2014-08-28 MED ORDER — THROMBIN 20000 UNITS EX SOLR
CUTANEOUS | Status: AC
  Filled 2014-08-28: qty 20000

## 2014-08-28 MED ORDER — 0.9 % SODIUM CHLORIDE (POUR BTL) OPTIME
TOPICAL | Status: DC | PRN
  Administered 2014-08-28: 1000 mL

## 2014-08-28 MED ORDER — LIDOCAINE HCL (PF) 2 % IJ SOLN
0.0000 mL | Freq: Once | INTRAMUSCULAR | Status: AC | PRN
  Filled 2014-08-28: qty 20

## 2014-08-28 MED ORDER — FUROSEMIDE 80 MG PO TABS
120.0000 mg | ORAL_TABLET | Freq: Every day | ORAL | Status: DC
  Administered 2014-08-28 – 2014-09-05 (×8): 120 mg via ORAL
  Filled 2014-08-28 (×9): qty 1

## 2014-08-28 MED ORDER — SODIUM CHLORIDE 0.9 % IV SOLN
INTRAVENOUS | Status: DC
  Administered 2014-08-31: 12:00:00 via INTRAVENOUS
  Administered 2014-09-02: 10 mL/h via INTRAVENOUS

## 2014-08-28 MED ORDER — PROTAMINE SULFATE 10 MG/ML IV SOLN
INTRAVENOUS | Status: DC | PRN
  Administered 2014-08-28 (×4): 10 mg via INTRAVENOUS

## 2014-08-28 MED ORDER — FENTANYL CITRATE 0.05 MG/ML IJ SOLN: 25.0000 ug | INTRAMUSCULAR | Status: DC | PRN

## 2014-08-28 MED ORDER — FENTANYL CITRATE 0.05 MG/ML IJ SOLN
INTRAMUSCULAR | Status: DC | PRN
  Administered 2014-08-28 (×3): 50 ug via INTRAVENOUS

## 2014-08-28 MED ORDER — SEVELAMER CARBONATE 800 MG PO TABS
1600.0000 mg | ORAL_TABLET | Freq: Three times a day (TID) | ORAL | Status: DC
  Administered 2014-08-29 – 2014-09-05 (×15): 1600 mg via ORAL
  Filled 2014-08-28 (×26): qty 2

## 2014-08-28 MED ORDER — EPHEDRINE SULFATE 50 MG/ML IJ SOLN
INTRAMUSCULAR | Status: DC | PRN
  Administered 2014-08-28: 10 mg via INTRAVENOUS

## 2014-08-28 MED ORDER — VANCOMYCIN HCL IN DEXTROSE 1-5 GM/200ML-% IV SOLN
1000.0000 mg | Freq: Once | INTRAVENOUS | Status: AC
  Administered 2014-08-29: 1000 mg via INTRAVENOUS
  Filled 2014-08-28 (×2): qty 200

## 2014-08-28 MED ORDER — LIDOCAINE HCL 4 % MT SOLN
OROMUCOSAL | Status: DC | PRN
  Administered 2014-08-28: 2 mL via TOPICAL

## 2014-08-28 MED ORDER — MIDAZOLAM HCL 2 MG/2ML IJ SOLN
INTRAMUSCULAR | Status: AC
  Filled 2014-08-28: qty 2

## 2014-08-28 MED ORDER — DARBEPOETIN ALFA-POLYSORBATE 100 MCG/0.5ML IJ SOLN: 100.0000 ug | INTRAMUSCULAR | Status: DC

## 2014-08-28 MED ORDER — PHENYLEPHRINE HCL 10 MG/ML IJ SOLN
10.0000 mg | INTRAVENOUS | Status: DC | PRN
  Administered 2014-08-28: 25 ug/min via INTRAVENOUS

## 2014-08-28 MED ORDER — MEROPENEM 1 G IV SOLR
1.0000 g | INTRAVENOUS | Status: DC
  Administered 2014-08-28 – 2014-08-29 (×2): 1 g via INTRAVENOUS
  Filled 2014-08-28 (×2): qty 1

## 2014-08-28 MED ORDER — LIDOCAINE-EPINEPHRINE (PF) 1 %-1:200000 IJ SOLN
INTRAMUSCULAR | Status: AC
  Filled 2014-08-28: qty 10

## 2014-08-28 MED ORDER — HEPARIN SODIUM (PORCINE) 1000 UNIT/ML IJ SOLN
INTRAMUSCULAR | Status: DC | PRN
  Administered 2014-08-28: 6000 [IU] via INTRAVENOUS

## 2014-08-28 MED ORDER — OXYCODONE-ACETAMINOPHEN 5-325 MG PO TABS
1.0000 | ORAL_TABLET | ORAL | Status: DC | PRN
  Administered 2014-08-31 – 2014-09-04 (×6): 2 via ORAL
  Filled 2014-08-28 (×4): qty 2
  Filled 2014-08-28: qty 1
  Filled 2014-08-28: qty 2

## 2014-08-28 MED ORDER — CISATRACURIUM BESYLATE 20 MG/10ML IV SOLN
INTRAVENOUS | Status: AC
  Filled 2014-08-28: qty 10

## 2014-08-28 MED ORDER — RENA-VITE PO TABS: 1.0000 | ORAL_TABLET | Freq: Every day | ORAL | Status: DC

## 2014-08-28 MED ORDER — SODIUM CHLORIDE 0.9 % IV SOLN
INTRAVENOUS | Status: DC | PRN
  Administered 2014-08-28: 10:00:00 via INTRAVENOUS

## 2014-08-28 MED ORDER — DARBEPOETIN ALFA-POLYSORBATE 100 MCG/0.5ML IJ SOLN
100.0000 ug | INTRAMUSCULAR | Status: DC
  Administered 2014-08-29: 100 ug via INTRAVENOUS
  Filled 2014-08-28 (×2): qty 0.5

## 2014-08-28 MED ORDER — FENTANYL CITRATE 0.05 MG/ML IJ SOLN
INTRAMUSCULAR | Status: AC
  Filled 2014-08-28: qty 5

## 2014-08-28 MED ORDER — OXYCODONE HCL 5 MG/5ML PO SOLN: 5.0000 mg | Freq: Once | ORAL | Status: DC | PRN

## 2014-08-28 MED ORDER — PROPOFOL 10 MG/ML IV BOLUS
INTRAVENOUS | Status: DC | PRN
  Administered 2014-08-28 (×2): 50 mg via INTRAVENOUS
  Administered 2014-08-28: 20 mg via INTRAVENOUS
  Administered 2014-08-28: 50 mg via INTRAVENOUS

## 2014-08-28 MED ORDER — OXYCODONE HCL 5 MG PO TABS: 5.0000 mg | ORAL_TABLET | Freq: Once | ORAL | Status: DC | PRN

## 2014-08-28 MED ORDER — ONDANSETRON HCL 4 MG/2ML IJ SOLN
INTRAMUSCULAR | Status: DC | PRN
  Administered 2014-08-28: 4 mg via INTRAVENOUS

## 2014-08-28 MED ORDER — DOXERCALCIFEROL 4 MCG/2ML IV SOLN
2.0000 ug | INTRAVENOUS | Status: DC
  Administered 2014-08-29: 2 ug via INTRAVENOUS
  Filled 2014-08-28 (×4): qty 2

## 2014-08-28 SURGICAL SUPPLY — 34 items
CANISTER SUCTION 2500CC (MISCELLANEOUS) ×3 IMPLANT
CLIP TI MEDIUM 6 (CLIP) ×3 IMPLANT
CLIP TI WIDE RED SMALL 6 (CLIP) ×3 IMPLANT
COVER SURGICAL LIGHT HANDLE (MISCELLANEOUS) ×3 IMPLANT
DECANTER SPIKE VIAL GLASS SM (MISCELLANEOUS) IMPLANT
DERMABOND ADVANCED (GAUZE/BANDAGES/DRESSINGS) ×2
DERMABOND ADVANCED .7 DNX12 (GAUZE/BANDAGES/DRESSINGS) ×1 IMPLANT
DRAIN PENROSE 1/4X12 LTX STRL (WOUND CARE) ×3 IMPLANT
ELECT REM PT RETURN 9FT ADLT (ELECTROSURGICAL) ×3
ELECTRODE REM PT RTRN 9FT ADLT (ELECTROSURGICAL) ×1 IMPLANT
GLOVE BIO SURGEON STRL SZ7.5 (GLOVE) ×3 IMPLANT
GLOVE BIOGEL PI IND STRL 8 (GLOVE) ×1 IMPLANT
GLOVE BIOGEL PI INDICATOR 8 (GLOVE) ×2
GOWN STRL REUS W/ TWL LRG LVL3 (GOWN DISPOSABLE) ×3 IMPLANT
GOWN STRL REUS W/TWL LRG LVL3 (GOWN DISPOSABLE) ×6
KIT BASIN OR (CUSTOM PROCEDURE TRAY) ×3 IMPLANT
KIT ROOM TURNOVER OR (KITS) ×3 IMPLANT
NEEDLE HYPO 25GX1X1/2 BEV (NEEDLE) ×3 IMPLANT
NS IRRIG 1000ML POUR BTL (IV SOLUTION) ×3 IMPLANT
PACK CV ACCESS (CUSTOM PROCEDURE TRAY) ×3 IMPLANT
PAD ARMBOARD 7.5X6 YLW CONV (MISCELLANEOUS) ×6 IMPLANT
PROBE PENCIL 8 MHZ STRL DISP (MISCELLANEOUS) ×3 IMPLANT
SPONGE GAUZE 4X4 12PLY STER LF (GAUZE/BANDAGES/DRESSINGS) ×3 IMPLANT
SPONGE SURGIFOAM ABS GEL 100 (HEMOSTASIS) IMPLANT
SUT ETHILON 3 0 PS 1 (SUTURE) ×3 IMPLANT
SUT PROLENE 6 0 BV (SUTURE) ×3 IMPLANT
SUT VIC AB 3-0 SH 27 (SUTURE) ×2
SUT VIC AB 3-0 SH 27X BRD (SUTURE) ×1 IMPLANT
SUT VICRYL 4-0 PS2 18IN ABS (SUTURE) IMPLANT
TAPE CLOTH SURG 4X10 WHT LF (GAUZE/BANDAGES/DRESSINGS) ×3 IMPLANT
TOWEL OR 17X24 6PK STRL BLUE (TOWEL DISPOSABLE) ×3 IMPLANT
TOWEL OR 17X26 10 PK STRL BLUE (TOWEL DISPOSABLE) ×3 IMPLANT
UNDERPAD 30X30 INCONTINENT (UNDERPADS AND DIAPERS) ×3 IMPLANT
WATER STERILE IRR 1000ML POUR (IV SOLUTION) ×3 IMPLANT

## 2014-08-28 NOTE — Progress Notes (Addendum)
ANTIBIOTIC CONSULT NOTE - FOLLOW UP  Pharmacy Consult for Vancomycin + Meropenem Indication: Enterococcus + Proteus Bacteremia  Allergies  Allergen Reactions  . Hydrocodone Other (See Comments)    Upset stomach  . Penicillins Other (See Comments)    Upset stomach    Patient Measurements: Height: 5\' 8"  (172.7 cm) Weight: 190 lb (86.183 kg) IBW/kg (Calculated) : 68.4  Vital Signs: Temp: 97.8 F (36.6 C) (10/06 1308) Temp Source: Oral (10/06 1308) BP: 133/68 mmHg (10/06 1308) Pulse Rate: 73 (10/06 1308) Intake/Output from previous day: 10/05 0701 - 10/06 0700 In: 320 [P.O.:120; IV Piggyback:200] Out: -  Intake/Output from this shift: Total I/O In: 450 [I.V.:450] Out: 50 [Blood:50]  Labs:  Recent Labs  08/27/14 2243 08/28/14 0509  WBC 9.8 8.0  HGB 7.9* 8.9*  PLT PLATELET CLUMPS NOTED ON SMEAR, COUNT APPEARS ADEQUATE 190  CREATININE 4.45* 4.92*   Estimated Creatinine Clearance: 13.2 ml/min (by C-G formula based on Cr of 4.92). No results found for this basename: VANCOTROUGH, VANCOPEAK, VANCORANDOM, GENTTROUGH, GENTPEAK, GENTRANDOM, TOBRATROUGH, TOBRAPEAK, TOBRARND, AMIKACINPEAK, AMIKACINTROU, AMIKACIN,  in the last 72 hours   Microbiology: No results found for this or any previous visit (from the past 720 hour(s)).  Anti-infectives   Start     Dose/Rate Route Frequency Ordered Stop   08/28/14 2000  meropenem (MERREM) 1 g in sodium chloride 0.9 % 100 mL IVPB     1 g 200 mL/hr over 30 Minutes Intravenous Every 24 hours 08/28/14 1617     08/27/14 2300  vancomycin (VANCOCIN) IVPB 1000 mg/200 mL premix     1,000 mg 200 mL/hr over 60 Minutes Intravenous  Once 08/27/14 2228 08/27/14 2354      Assessment: 80 YOM with ESRD [facility] for management of AVG infection. The patient continues on Vancomycin + Meropenem for Enterococcus + Proteus bacteremia [facility]. The patient's AVG was removed this morning by [facility] - plans  are for HD on 10/7.  The patient received a dose of Vancomycin 1g [facility] - the Meropenem dose was held as the [facility]. Will obtain a random Vanc level today to further guide Vancomycin doses and schedule regular Meropenem doses.   Goal of Therapy:  Pre-HD Vancomycin level of 15-25 mcg/ml  Plan:  1. Obtain random Vancomycin level today 2. Meropenem 1g IV every 24 hours  3. Will continue to follow HD schedule/duration, culture results, LOT, and antibiotic de-escalation plans   Georgina PillionElizabeth Martin, PharmD, BCPS Clinical Pharmacist Pager: [phone] 08/28/2014 4:28 PM     Addendum:   Vancomycin random level today = 23.4 (goal 15-25 mcg/ml).  Pt does not need additional Vancomycin doses until after HD - scheduled for tomorrow.  Will order Vancomycin 1gm IV x 1 dose for tomorrow with HD.  Will not order standing Vancomycin regimen at this time since pt is off usual outpt schedule.  Toys 'R' UsKimberly Aspen Lawrance, Pharm.D., BCPS Clinical Pharmacist Pager [phone] 08/28/2014 6:41 PM

## 2014-08-28 NOTE — Progress Notes (Signed)
PROGRESS NOTE  Noah PhiStuart Juarez ZOX:[id number] DOB: 1934-06-07 DOA: 08/27/2014 PCP: Noah AbuAMIN,ASIM, MD  Assessment/Plan: Cellulitis in right arm, bacteremia, sepsis:  -likely due to recent AV graft placemet infection -bacteremia, with enterococcus and Proteus mirabilis, which are sensitive to vancomycin and meropenem.  -vascular surgery consult- for graft removal today - continue IV vancomycin and meropenem  - repeat blood culture done on admission   DM-II:  - decrease 70/30 insulin to 15 U bid  - SSI    ESRD-HD: T/T/S. He had full course HD on 08/27/14 per transfer note  - renal consult  CHF:  dry on admission.   -will hold lasix for now - watch volume status carefully   HTN:   -  continue home medications, including amlodipine, Coreg  - Hold Lasix as above   GERD: continue PPI    Right arm weakness and mumbling speech:  -not sure about his baseline.  -CT-head on 08/21/14 showed remote inferior right cerebellar infarct.      Code Status: full Family Communication: patient, no family at bedside Disposition Plan:    Consultants:  Renal  vascular  Procedures:      HPI/Subjective: Having pain in right arm  Objective: Filed Vitals:   08/28/14 0300  BP: 156/76  Pulse: 76  Temp: 98.7 F (37.1 C)  Resp: 18    Intake/Output Summary (Last 24 hours) at 08/28/14 0824 Last data filed at 08/28/14 0600  Gross per 24 hour  Intake    320 ml  Output      0 ml  Net    320 ml   Filed Weights   08/27/14 2055  Weight: 86.183 kg (190 lb)    Exam:   General:  Pleasant/cooperative- voice difficult to understand at times  Cardiovascular: rrr  Respiratory: no wheezing  Abdomen: +Bs, soft  Musculoskeletal: right arm with redness and tenderness around upper extremity   Data Reviewed: Basic Metabolic Panel:  Recent Labs Lab 08/27/14 2243 08/28/14 0509  NA 136* 134*  K 4.6 4.4  CL 93* 92*  CO2 29 28  GLUCOSE 86 71  BUN 33* 39*  CREATININE 4.45* 4.92*    CALCIUM 8.4 8.4   Liver Function Tests:  Recent Labs Lab 08/27/14 2243  AST 41*  ALT 27  ALKPHOS 332*  BILITOT 1.1  PROT 7.7  ALBUMIN 2.0*   No results found for this basename: LIPASE, AMYLASE,  in the last 168 hours No results found for this basename: AMMONIA,  in the last 168 hours CBC:  Recent Labs Lab 08/27/14 2243 08/28/14 0509  WBC 9.8 8.0  NEUTROABS 7.0  --   HGB 7.9* 8.9*  HCT 23.3* 27.1*  MCV 89.3 90.0  PLT PLATELET CLUMPS NOTED ON SMEAR, COUNT APPEARS ADEQUATE 190   Cardiac Enzymes: No results found for this basename: CKTOTAL, CKMB, CKMBINDEX, TROPONINI,  in the last 168 hours BNP (last 3 results) No results found for this basename: PROBNP,  in the last 8760 hours CBG:  Recent Labs Lab 08/27/14 2053 08/28/14 0159  GLUCAP 95 79    No results found for this or any previous visit (from the past 240 hour(s)).   Studies: No results found.  Scheduled Meds: . allopurinol  100 mg Oral Daily  . amLODipine  10 mg Oral Daily  . aspirin EC  81 mg Oral Daily  . atorvastatin  20 mg Oral q1800  . calcitRIOL  0.5 mcg Oral Daily  . carvedilol  12.5 mg Oral BID WC  . clopidogrel  75 mg Oral Daily  . heparin  5,000 Units Subcutaneous 3 times per day  . insulin aspart  0-9 Units Subcutaneous TID WC  . insulin aspart protamine- aspart  15 Units Subcutaneous BID WC  . metoCLOPramide  5 mg Oral BID  . multivitamin  1 tablet Oral QHS  . nitroGLYCERIN  0.4 mg Transdermal Daily  . pantoprazole  40 mg Oral Daily  . sodium bicarbonate  1,300 mg Oral BID  . sucralfate  1 g Oral TID   Continuous Infusions:  Antibiotics Given (last 72 hours)   Date/Time Action Medication Dose Rate   08/27/14 2254 Given   vancomycin (VANCOCIN) IVPB 1000 mg/200 mL premix 1,000 mg 200 mL/hr      Principal Problem:   Cellulitis Active Problems:   ESRD needing dialysis   DM2 (diabetes mellitus, type 2)   HTN (hypertension)   Sepsis   Hypertension   CHF (congestive heart  failure)   Peripheral vascular disease   GERD (gastroesophageal reflux disease)   Gout   Bacteremia   History of stroke    Time spent: 35  min    [name]  [facility] Pager [phone]. If 7PM-7AM, please contact night-coverage at [email or web address], password [name] 08/28/2014, 8:24 AM  LOS: 1 day

## 2014-08-28 NOTE — Consult Note (Signed)
[name] is an 80 y.o. male referred by Dr Benjamine Mola   Chief Complaint: ESRD, anemia, Sec HPTH HPI: 80yo BM with ESRD on HD in [place] [facility] schedule, admitted to [facility] 08/21/14 for fever and found to be bacteremic with [facility] + for enterococcus and Proteus.  He had evidence of infection of Rt upper arm and is post AVG placement on 07/31/14.  Sent to [facility] due to concern of AVG infection.  Because of concern of infection and no bruit in the AVG, pt was taken to surgery today for removal of AVG.  Past Medical History  Diagnosis Date  . Hypertension   . CHF (congestive heart failure)   . Peripheral vascular disease   . Renal disease   . High cholesterol   . GERD (gastroesophageal reflux disease)   . Type II diabetes mellitus   . Arthritis     "all over"  . Chronic lower back pain   . Gout   . Shortness of breath   . Sleep apnea     does not  use cpap  . Stroke     family member states he's not had a stroke    Past Surgical History  Procedure Laterality Date  . Cholecystectomy    . Arteriovenous graft placement Left 02/2011    [name] 03/12/2011  . Peritoneal catheter insertion      [name] 06/13/2014  . Humerus fracture surgery Right   . Insertion of dialysis catheter Right 06/15/2014    Procedure: INSERTION OF DIALYSIS CATHETER;  Surgeon: Larina Earthly, MD;  Location: [facility] OR;  Service: Vascular;  Laterality: Right;  Internal Jugular  . Colonoscopy    . Eye surgery Bilateral     cataract surgery  . Av fistula placement Right 07/31/2014    Procedure: INSERTION OF ARTERIOVENOUS (AV) GORE-TEX GRAFT Right Upper  ARM;  Surgeon: Sherren Kerns, MD;  Location: [facility] OR;  Service: Vascular;  Laterality: Right;    Family History  Problem Relation Age of Onset  . Stroke Mother   . Hypertension Father    Social History:  reports that he quit smoking about 40 years ago. He has never used smokeless tobacco. He reports that he does not drink alcohol or use illicit drugs.  Allergies:  Allergies   Allergen Reactions  . Hydrocodone Other (See Comments)    Upset stomach  . Penicillins Other (See Comments)    Upset stomach    Medications Prior to Admission  Medication Sig Dispense Refill  . allopurinol (ZYLOPRIM) 100 MG tablet Take 100 mg by mouth daily.      Marland Kitchen amLODipine (NORVASC) 10 MG tablet Take 10 mg by mouth daily.      Marland Kitchen aspirin EC 81 MG tablet Take 81 mg by mouth daily.      Marland Kitchen atorvastatin (LIPITOR) 20 MG tablet Take 20 mg by mouth daily at 6 PM.      . calcitRIOL (ROCALTROL) 0.5 MCG capsule Take 0.5 mcg by mouth daily.      . carvedilol (COREG) 12.5 MG tablet Take 12.5 mg by mouth 2 (two) times daily with a meal.      . clopidogrel (PLAVIX) 75 MG tablet Take 75 mg by mouth daily.      . folic acid-vitamin b complex-vitamin c-selenium-zinc (DIALYVITE) 3 MG TABS tablet Take 1 tablet by mouth daily.      . furosemide (LASIX) 40 MG tablet Take 120 mg by mouth daily.      Marland Kitchen glipiZIDE (  GLUCOTROL XL) 10 MG 24 hr tablet Take 10 mg by mouth daily with breakfast.      . insulin lispro protamine-lispro (HUMALOG 75/25 MIX) (75-25) 100 UNIT/ML SUSP injection Inject 20 Units into the skin 2 (two) times daily.      Marland Kitchen. loratadine (CLARITIN) 10 MG tablet Take 10 mg by mouth daily as needed for allergies.       Marland Kitchen. metoCLOPramide (REGLAN) 5 MG tablet Take 5 mg by mouth 2 (two) times daily.      . nitroGLYCERIN (NITRODUR - DOSED IN MG/24 HR) 0.4 mg/hr patch Place 0.4 mg onto the skin daily.      . nitroGLYCERIN (NITROSTAT) 0.4 MG SL tablet Place 0.4 mg under the tongue every 5 (five) minutes as needed for chest pain.      Marland Kitchen. omeprazole (PRILOSEC) 40 MG capsule Take 40 mg by mouth daily.      . potassium chloride 20 MEQ/15ML (10%) solution Take 20 mEq by mouth daily.       . sodium bicarbonate 650 MG tablet Take 1,300 mg by mouth 2 (two) times daily.      . sorbitol 70 % solution Take 30 mLs by mouth daily as needed (constipation).      . sucralfate (CARAFATE) 1 G tablet Take 1 g by mouth 3  (three) times daily.      . traMADol (ULTRAM) 50 MG tablet Take 1 tablet (50 mg total) by mouth every 6 (six) hours as needed.  20 tablet  0  . zolpidem (AMBIEN) 5 MG tablet Take 2.5 mg by mouth at bedtime as needed for sleep.         Lab Results: UA: ND   Recent Labs  08/27/14 2243 08/28/14 0509  WBC 9.8 8.0  HGB 7.9* 8.9*  HCT 23.3* 27.1*  PLT PLATELET CLUMPS NOTED ON SMEAR, COUNT APPEARS ADEQUATE 190   BMET  Recent Labs  08/27/14 2243 08/28/14 0509  NA 136* 134*  K 4.6 4.4  CL 93* 92*  CO2 29 28  GLUCOSE 86 71  BUN 33* 39*  CREATININE 4.45* 4.92*  CALCIUM 8.4 8.4   LFT  Recent Labs  08/27/14 2243  PROT 7.7  ALBUMIN 2.0*  AST 41*  ALT 27  ALKPHOS 332*  BILITOT 1.1   No results found.  ROS: No CP No SOB No ABD pain Some pain in Rt arm No change in vision No dysuria  PHYSICAL EXAM: Blood pressure 156/76, pulse 76, temperature 98.7 F (37.1 C), temperature source Oral, resp. rate 18, height 5\' 8"  (1.727 m), weight 86.183 kg (190 lb), SpO2 94.00%. HEENT: PERRLA EOMI NECK:No JVD  Rt IJ perm cath LUNGS:Few basilar crackles CARDIAC:RRR 1-2/6 systolic M ABD:+ BS NTND No HSM EXT:No edema NEURO:CNI no asterixis.  Answers ? But not always with correct answer  Assessment: 1. ESRD 2. Infected AVG SP removal 3. Enterocoocus and Proteus bacteremia 4. Anemia on EPO 5. Sec HPTH on hectorol 6. HTN 7. DM PLAN: 1. Will plan HD today 2. Cont AB 3. Resume ESA and hectorol.  DC calcitriol 4. I think he will need his permcath removed tomorrow after HD and replaced on Friday.  I spoke to [facility] office about this.   [name] 08/28/2014, 9:44 AM

## 2014-08-28 NOTE — Op Note (Signed)
    NAME: [name]    MRN: [id number] DOB: Jul 05, 1934    DATE OF OPERATION: 08/28/2014  PREOP DIAGNOSIS: Infected right upper arm AV graft  POSTOP DIAGNOSIS: same  PROCEDURE:  1. Removal of infected right upper arm AV graft 2. Vein patch angioplasty of the right brachial artery  SURGEON: Di Kindlehristopher S. Edilia Boickson, MD, FACS  ASSIST: Tilden FossaLanita Presson, RNFA  ANESTHESIA: Gen.   EBL: minimal  INDICATIONS: [name] is a 80 y.o. male who was found to have an infected right upper arm AV graft. I was asked to remove the graft.  FINDINGS: purulent drainage around the entire right upper arm AV graft. Intraoperative culture was sent.  TECHNIQUE: The patient was taken to the operating room and received a general anesthetic. The right upper extremity was prepped and draped in the usual sterile fashion. Incision in the axilla was opened and dissection carried down to the venous limb of the graft. There was gross purulence around the graft. An intraoperative culture was sent. The graft was ligated here with a 2-0 silk. Next the arterial limb of the graft was also dissected free and ligated. The graft was then divided at both ends and easily was brought through the tunnel in its entirety. At the venous and the vein was dissected free proximal and distal to the anastomosis. Ligated both ends. The anastomosis was then excised and then a segment of vein harvested from this excised segment to be used as a vein patch.  Next attention was turned to the arterial anastomosis. The graft was placed a month ago and there was dense scar tissue around the arterial anastomosis making the dissection difficult. However ultimately was able to get control of the artery above and below the anastomosis. The patient was then heparinized. The artery was clamped proximally and distally to the anastomosis. The graft was removed entirely from the brachial artery. The vein patch was then sewn using continuous 6-0 Prolene suture. At  the completion there was a good radial and ulnar signal with the Doppler. The wounds irrigated with cups amounts of saline. A 15 Penrose drain was placed into both tunnel tracts. The deep layer was closed with 3-0 Vicryl. The skin was closed loosely with interrupted 3-0 nylon. Sterile dressing was applied. The patient tolerated the procedure well was transferred to the recovery room in stable condition. All needle and sponge counts were correct.  Waverly Ferrarihristopher Dickson, MD, FACS [facility]  DATE OF DICTATION:   08/28/2014

## 2014-08-28 NOTE — Progress Notes (Signed)
PT Cancellation Note  Patient Details Name: [name] MRN: [id number] DOB: [birth year]   Cancelled Treatment:    Reason Eval/Treat Not Completed: Patient at procedure or test/unavailable  Currently in OR; Will follow up later today as time allows;  Otherwise, will follow up for PT tomorrow;   Thank you,  Van ClinesHolly Pavle Wiler, PT  Acute Rehabilitation Services Pager [phone] Office [phone]     Van ClinesGarrigan, Daun Rens Hamff 08/28/2014, 11:00 AM

## 2014-08-28 NOTE — Anesthesia Postprocedure Evaluation (Signed)
  Anesthesia Post-op Note  Patient: [name]  Procedure(s) Performed: Procedure(s): REMOVAL OF ARTERIOVENOUS GORETEX GRAFT (AVGG) Right Upper Arm (Right)  Patient Location: PACU  Anesthesia Type:General  Level of Consciousness: awake, alert  and oriented  Airway and Oxygen Therapy: Patient Spontanous Breathing and Patient connected to nasal cannula oxygen  Post-op Pain: mild  Post-op Assessment: Post-op Vital signs reviewed, Patient's Cardiovascular Status Stable, Respiratory Function Stable, RESPIRATORY FUNCTION UNSTABLE, No signs of Nausea or vomiting and Pain level controlled  Post-op Vital Signs: stable  Last Vitals:  Filed Vitals:   08/28/14 1750  BP: 139/70  Pulse: 72  Temp: 36.5 C  Resp: 16    Complications: No apparent anesthesia complications

## 2014-08-28 NOTE — Anesthesia Preprocedure Evaluation (Addendum)
Anesthesia Evaluation  Patient identified by MRN, date of birth, ID band Patient awake    Reviewed: Allergy & Precautions, H&P , NPO status , Patient's Chart, lab work & pertinent test results  History of Anesthesia Complications Negative for: history of anesthetic complications  Airway Mallampati: III TM Distance: >3 FB Neck ROM: Full    Dental  (+) Teeth Intact,    Pulmonary shortness of breath and at rest, sleep apnea , former smoker,  Denies sleep apena, visually short of breath laying in bed, H/o COPD with pursed lip exhalation breath sounds clear to auscultation        Cardiovascular hypertension, Pt. on medications and Pt. on home beta blockers - angina+ Peripheral Vascular Disease and +CHF - Past MI - dysrhythmias Rhythm:Regular  Denies angina but takes NTG occasionally for SOB without benefit, h/o CHF per patient with TTE/TEE in Epic   Neuro/Psych CVA, No Residual Symptoms negative psych ROS   GI/Hepatic Neg liver ROS, GERD-  Medicated and Controlled,  Endo/Other  diabetes, Type 2, Insulin DependentMorbid obesity  Renal/GU ESRF and DialysisRenal diseaseInfected right arm graft infection, last dialyzed 10/5 (without complications) via Right IJ hemodialysis catheter      Musculoskeletal  (+) Arthritis -, Osteoarthritis,    Abdominal   Peds  Hematology  (+) anemia ,   Anesthesia Other Findings   Reproductive/Obstetrics                          Anesthesia Physical Anesthesia Plan  ASA: III  Anesthesia Plan: General   Post-op Pain Management:    Induction: Intravenous  Airway Management Planned: Oral ETT  Additional Equipment: None  Intra-op Plan:   Post-operative Plan: Extubation in OR and Possible Post-op intubation/ventilation  Informed Consent: I have reviewed the patients History and Physical, chart, labs and discussed the procedure including the risks, benefits and  alternatives for the proposed anesthesia with the patient or authorized representative who has indicated his/her understanding and acceptance.   Dental advisory given  Plan Discussed with: CRNA and Surgeon  Anesthesia Plan Comments:         Anesthesia Quick Evaluation

## 2014-08-28 NOTE — Progress Notes (Signed)
DC'd IVF to right Diatek catheter. Flushed blue port with 10cc NS and 2.5cc heparin (1,00 units/cc).  Capped and clamped and taped.

## 2014-08-28 NOTE — H&P (View-Only) (Signed)
    Patient name: [name] MRN: [id number] DOB: 10/16/1934 Sex: male   Referred by: [facility]  Reason for referral: No chief complaint on file.   HISTORY OF PRESENT ILLNESS: The patient was transferred this evening from [facility]. Apparently he was admitted there for 4 days with erythema around his right upper arm graft. He is being dialyzed currently being and right IJ tunneled catheter. He reports that he had dialysis this morning. Appears to be somewhat of a poor historian. Did have placement of a new upper arm loop graft with Dr. fields: 07/31/2014. Apparently continued to have sepsis and was transferred to [place] for further treatment.  Past Medical History  Diagnosis Date  . Hypertension   . CHF (congestive heart failure)   . Peripheral vascular disease   . Renal disease   . High cholesterol   . GERD (gastroesophageal reflux disease)   . Type II diabetes mellitus   . Arthritis     "all over"  . Chronic lower back pain   . Gout   . Shortness of breath   . Sleep apnea     does not  use cpap  . Stroke     family member states he's not had a stroke    Past Surgical History  Procedure Laterality Date  . Cholecystectomy    . Arteriovenous graft placement Left 02/2011    /notes 03/12/2011  . Peritoneal catheter insertion      /notes 06/13/2014  . Humerus fracture surgery Right   . Insertion of dialysis catheter Right 06/15/2014    Procedure: INSERTION OF DIALYSIS CATHETER;  Surgeon: Aubree Doody F Ladanian Kelter, MD;  Location: [facility] OR;  Service: Vascular;  Laterality: Right;  Internal Jugular  . Colonoscopy    . Eye surgery Bilateral     cataract surgery  . Av fistula placement Right 07/31/2014    Procedure: INSERTION OF ARTERIOVENOUS (AV) GORE-TEX GRAFT Right Upper  ARM;  Surgeon: Charles E Fields, MD;  Location: [facility] OR;  Service: Vascular;  Laterality: Right;    History   Social History  . Marital Status: Divorced    Spouse Name: N/A    Number of Children: N/A   . Years of Education: N/A   Occupational History  . Not on file.   Social History Main Topics  . Smoking status: Former Smoker    Quit date: 07/18/1974  . Smokeless tobacco: Never Used  . Alcohol Use: No  . Drug Use: No  . Sexual Activity: No   Other Topics Concern  . Not on file   Social History Narrative  . No narrative on file    No family history on file.  Allergies as of 08/27/2014 - Review Complete 08/27/2014  Allergen Reaction Noted  . Hydrocodone Other (See Comments) 06/13/2014  . Penicillins Other (See Comments) 06/13/2014    No current facility-administered medications on file prior to encounter.   Current Outpatient Prescriptions on File Prior to Encounter  Medication Sig Dispense Refill  . allopurinol (ZYLOPRIM) 100 MG tablet Take 100 mg by mouth daily.      . amLODipine (NORVASC) 10 MG tablet Take 10 mg by mouth daily.      . aspirin EC 81 MG tablet Take 81 mg by mouth daily.      . atorvastatin (LIPITOR) 20 MG tablet Take 20 mg by mouth daily at 6 PM.      . calcitRIOL (ROCALTROL) 0.5 MCG capsule Take 0.5 mcg by mouth daily.      .   carvedilol (COREG) 12.5 MG tablet Take 12.5 mg by mouth 2 (two) times daily with a meal.      . clopidogrel (PLAVIX) 75 MG tablet Take 75 mg by mouth daily.      . folic acid-vitamin b complex-vitamin c-selenium-zinc (DIALYVITE) 3 MG TABS tablet Take 1 tablet by mouth daily.      . furosemide (LASIX) 40 MG tablet Take 120 mg by mouth daily.      . glipiZIDE (GLUCOTROL XL) 10 MG 24 hr tablet Take 10 mg by mouth daily with breakfast.      . insulin lispro protamine-lispro (HUMALOG 75/25 MIX) (75-25) 100 UNIT/ML SUSP injection Inject 20 Units into the skin 2 (two) times daily.      . loratadine (CLARITIN) 10 MG tablet Take 10 mg by mouth daily as needed for allergies.       . metoCLOPramide (REGLAN) 5 MG tablet Take 5 mg by mouth 2 (two) times daily.      . nitroGLYCERIN (NITRODUR - DOSED IN MG/24 HR) 0.4 mg/hr patch Place 0.4 mg  onto the skin daily.      . nitroGLYCERIN (NITROSTAT) 0.4 MG SL tablet Place 0.4 mg under the tongue every 5 (five) minutes as needed for chest pain.      . omeprazole (PRILOSEC) 40 MG capsule Take 40 mg by mouth daily.      . potassium chloride 20 MEQ/15ML (10%) solution Take 20 mEq by mouth daily.       . sodium bicarbonate 650 MG tablet Take 1,300 mg by mouth 2 (two) times daily.      . sorbitol 70 % solution Take 30 mLs by mouth daily as needed (constipation).      . sucralfate (CARAFATE) 1 G tablet Take 1 g by mouth 3 (three) times daily.      . traMADol (ULTRAM) 50 MG tablet Take 1 tablet (50 mg total) by mouth every 6 (six) hours as needed.  20 tablet  0  . zolpidem (AMBIEN) 5 MG tablet Take 2.5 mg by mouth at bedtime as needed for sleep.          PHYSICAL EXAMINATION:  General: The patient is a well-nourished male, in no acute distress. During a great deal coughing. No respiratory distress  Vital signs are There were no vitals taken for this visit. Pulmonary: There is a good air exchange  Musculoskeletal:difficulty moving his right shoulder Neurologic: No focal weakness or paresthesias are detected, Skin: Erythema over graft. Nol fluctuance. Tender over his graft as well. No drainage from upper arm incisions Psychiatric: The patient has normal affect. Cardiovascular: Palpable right radial pulse No thrill in his right upper arm AV graft    Impression and Plan:  Labs pending currently. Apparent continued cellulitis around the new right upper arm graft despite 4 days of antibiotics to more hospital. The graft is occluded by physical exam with no thrill present. Discussed with patient. Recommend removal of the graft do 2 persistent erythema a nonfunctional state of graft. We'll make n.p.o. Reassess in the morning to determine if he is able to undergo surgery tomorrow    Antonieta Slaven [facility] [place] Office: [phone]         

## 2014-08-28 NOTE — Interval H&P Note (Signed)
History and Physical Interval Note:  08/28/2014 9:42 AM  [name]  has presented today for surgery, with the diagnosis of Right arm infected graft   The various methods of treatment have been discussed with the patient and family. After consideration of risks, benefits and other options for treatment, the patient has consented to  Procedure(s): REMOVAL OF ARTERIOVENOUS GORETEX GRAFT (AVGG) (Right) as a surgical intervention .  The patient's history has been reviewed, patient examined, no change in status, stable for surgery.  I have reviewed the patient's chart and labs.  Questions were answered to the patient's satisfaction.     Robert Sunga S

## 2014-08-28 NOTE — Transfer of Care (Signed)
Immediate Anesthesia Transfer of Care Note  Patient: [name]  Procedure(s) Performed: Procedure(s): REMOVAL OF ARTERIOVENOUS GORETEX GRAFT (AVGG) Right Upper Arm (Right)  Patient Location: PACU  Anesthesia Type:General  Level of Consciousness: awake and alert   Airway & Oxygen Therapy: Patient Spontanous Breathing and Patient connected to face mask oxygen  Post-op Assessment: Report given to PACU RN, Post -op Vital signs reviewed and stable and Patient moving all extremities  Post vital signs: Reviewed and stable  Complications: No apparent anesthesia complications

## 2014-08-29 ENCOUNTER — Inpatient Hospital Stay (HOSPITAL_COMMUNITY): Payer: Medicare Other

## 2014-08-29 DIAGNOSIS — N189 Chronic kidney disease, unspecified: Secondary | ICD-10-CM

## 2014-08-29 DIAGNOSIS — E1122 Type 2 diabetes mellitus with diabetic chronic kidney disease: Secondary | ICD-10-CM

## 2014-08-29 LAB — RENAL FUNCTION PANEL
ALBUMIN: 1.9 g/dL — AB (ref 3.5–5.2)
ANION GAP: 11 (ref 5–15)
BUN: 58 mg/dL — AB (ref 6–23)
CALCIUM: 8.2 mg/dL — AB (ref 8.4–10.5)
CO2: 31 mEq/L (ref 19–32)
Chloride: 91 mEq/L — ABNORMAL LOW (ref 96–112)
Creatinine, Ser: 6.79 mg/dL — ABNORMAL HIGH (ref 0.50–1.35)
GFR calc Af Amer: 8 mL/min — ABNORMAL LOW (ref >90)
GFR calc non Af Amer: 7 mL/min — ABNORMAL LOW (ref >90)
Glucose, Bld: 77 mg/dL (ref 70–99)
POTASSIUM: 4.9 meq/L (ref 3.7–5.3)
Phosphorus: 4.5 mg/dL (ref 2.3–4.6)
Sodium: 133 mEq/L — ABNORMAL LOW (ref 137–147)

## 2014-08-29 LAB — CBC
HCT: 23.2 % — ABNORMAL LOW (ref 39.0–52.0)
HEMOGLOBIN: 7.6 g/dL — AB (ref 13.0–17.0)
MCH: 29.6 pg (ref 26.0–34.0)
MCHC: 32.8 g/dL (ref 30.0–36.0)
MCV: 90.3 fL (ref 78.0–100.0)
Platelets: 198 10*3/uL (ref 150–400)
RBC: 2.57 MIL/uL — ABNORMAL LOW (ref 4.22–5.81)
RDW: 14.7 % (ref 11.5–15.5)
WBC: 8.4 10*3/uL (ref 4.0–10.5)

## 2014-08-29 LAB — GLUCOSE, CAPILLARY
GLUCOSE-CAPILLARY: 107 mg/dL — AB (ref 70–99)
GLUCOSE-CAPILLARY: 110 mg/dL — AB (ref 70–99)
Glucose-Capillary: 117 mg/dL — ABNORMAL HIGH (ref 70–99)
Glucose-Capillary: 123 mg/dL — ABNORMAL HIGH (ref 70–99)

## 2014-08-29 LAB — HEPATITIS B SURFACE ANTIGEN: Hepatitis B Surface Ag: NEGATIVE

## 2014-08-29 MED ORDER — DARBEPOETIN ALFA-POLYSORBATE 100 MCG/0.5ML IJ SOLN
INTRAMUSCULAR | Status: AC
  Administered 2014-08-29: 100 ug via INTRAVENOUS
  Filled 2014-08-29: qty 0.5

## 2014-08-29 MED ORDER — HEPARIN SODIUM (PORCINE) 5000 UNIT/ML IJ SOLN
5000.0000 [IU] | Freq: Three times a day (TID) | INTRAMUSCULAR | Status: DC
  Administered 2014-08-29 – 2014-09-05 (×17): 5000 [IU] via SUBCUTANEOUS
  Filled 2014-08-29 (×24): qty 1

## 2014-08-29 MED ORDER — DOXERCALCIFEROL 4 MCG/2ML IV SOLN
INTRAVENOUS | Status: AC
  Administered 2014-08-29: 2 ug via INTRAVENOUS
  Filled 2014-08-29: qty 2

## 2014-08-29 MED ORDER — WHITE PETROLATUM GEL
Status: DC | PRN
  Administered 2014-08-29: 0.2 via TOPICAL
  Filled 2014-08-29: qty 5
  Filled 2014-08-29 (×2): qty 28.35

## 2014-08-29 MED ORDER — LIDOCAINE HCL (PF) 2 % IJ SOLN
0.0000 mL | Freq: Once | INTRAMUSCULAR | Status: DC | PRN
  Filled 2014-08-29: qty 20

## 2014-08-29 MED ORDER — LIDOCAINE HCL 1 % IJ SOLN
INTRAMUSCULAR | Status: AC
  Filled 2014-08-29: qty 20

## 2014-08-29 MED ORDER — SODIUM CHLORIDE 0.9 % IV SOLN
500.0000 mg | INTRAVENOUS | Status: DC
  Filled 2014-08-29: qty 0.5

## 2014-08-29 MED ORDER — CHLORHEXIDINE GLUCONATE 4 % EX LIQD
CUTANEOUS | Status: AC
  Filled 2014-08-29: qty 15

## 2014-08-29 NOTE — Progress Notes (Signed)
PT Cancellation Note  Patient Details Name: [name] MRN: [id number] DOB: Mar 25, 1934   Cancelled Treatment:    Reason Eval/Treat Not Completed: Patient at procedure or test/unavailable  Currently in HD; Will follow up later today as time allows;  Otherwise, will follow up for PT tomorrow;   Thank you,  Van ClinesHolly Nasim Juarez, PT  Acute Rehabilitation Services Pager [phone] Office [phone]     Van ClinesGarrigan, [name] [facility] 08/29/2014, 8:26 AM

## 2014-08-29 NOTE — Progress Notes (Addendum)
  Progress Note    08/29/2014 2:06 PM 1 Day Post-Op  Subjective:  No complaints  Afebrile x 24 hrs  Filed Vitals:   08/29/14 1129  BP: 127/68  Pulse: 68  Temp: 98.3 F (36.8 C)  Resp: 18    Physical Exam: Incisions:  Drain in place-sutures are c/d/i.  serosanguinous drainage on bandage. Erythema right upper arm Extremities:  + palpable right radial pulse.  CBC    Component Value Date/Time   WBC 8.4 08/29/2014 0717   RBC 2.57* 08/29/2014 0717   HGB 7.6* 08/29/2014 0717   HCT 23.2* 08/29/2014 0717   PLT 198 08/29/2014 0717   MCV 90.3 08/29/2014 0717   MCH 29.6 08/29/2014 0717   MCHC 32.8 08/29/2014 0717   RDW 14.7 08/29/2014 0717   LYMPHSABS 1.7 08/27/2014 2243   MONOABS 0.9 08/27/2014 2243   EOSABS 0.2 08/27/2014 2243   BASOSABS 0.0 08/27/2014 2243    BMET    Component Value Date/Time   NA 133* 08/29/2014 0717   K 4.9 08/29/2014 0717   CL 91* 08/29/2014 0717   CO2 31 08/29/2014 0717   GLUCOSE 77 08/29/2014 0717   BUN 58* 08/29/2014 0717   CREATININE 6.79* 08/29/2014 0717   CALCIUM 8.2* 08/29/2014 0717   GFRNONAA 7* 08/29/2014 0717   GFRAA 8* 08/29/2014 0717    INR    Component Value Date/Time   INR 1.17 08/27/2014 2243     Intake/Output Summary (Last 24 hours) at 08/29/14 1406 Last data filed at 08/29/14 1129  Gross per 24 hour  Intake    382 ml  Output   2000 ml  Net  -1618 ml     Assessment:  80 y.o. male is s/p:  1. Removal of infected right upper arm AV graft  2. Vein patch angioplasty of the right brachial artery  1 Day Post-Op  Plan: -pt doing well.  Incision is clean with sutures in tact.  There is some drainage with drain removal. -drain removed and dry gauze placed with ace wrap to avoid tape to skin.  Change dressing as needed and daily. -wound culture with no growth x 1 day.- continue ABx -return to see Dr.  Edilia Boickson in 10-14 days for suture removal.    Doreatha MassedSamantha Rhyne, PA-C [facility] [phone] 08/29/2014 2:06 PM  Agree  with above.   Waverly Ferrarihristopher Lamark Schue, MD, FACS Beeper [phone] 08/30/2014

## 2014-08-29 NOTE — Procedures (Signed)
Pt seen on HD.  Ap 220 Vp 210.  BFR 350.  I spoke with [facility] yest about getting permcath out today and placing a new one on Friday

## 2014-08-29 NOTE — Progress Notes (Signed)
OT Cancellation Note:  Pt currently in HD, will continue to follow. 08/29/2014 Martie RoundJulie Lutisha Knoche, OTR/L Pager: [phone]

## 2014-08-29 NOTE — Evaluation (Signed)
Physical Therapy Evaluation Patient Details Name: [name] MRN: [id number] DOB: 1934-09-19 Today's Date: 08/29/2014   History of Present Illness  HPI: [name] is a 80 y.o. male with past medical history of COPD, type 2 diabetes, hypertension, end-stage renal disease on hemodialysis, history of TIA, hyperlipidemia, congestive heart failure, who presents with right upper arm pain, sweating and fever. Perm HD cath out 10/7 with plans for new one on 10/9  Clinical Impression   Pt admitted with above. Pt currently with functional limitations due to the deficits listed below (see PT Problem List).  Pt will benefit from skilled PT to increase their independence and safety with mobility to allow discharge to the venue listed below.   Will need more info re: available assist at home; I believe pt will benefit from post-acute rehab to maximize independence and safety with mobility prior to dc home and facilitate return to PLOF  Will request screen for Inpt Rehab      Follow Up Recommendations Other (comment) (postacute rehab)    Equipment Recommendations  Rolling walker with 5" wheels;3in1 (PT)    Recommendations for Other Services OT consult     Precautions / Restrictions Precautions Precautions: Fall      Mobility  Bed Mobility Overal bed mobility: Needs Assistance Bed Mobility: Supine to Sit     Supine to sit: Mod assist     General bed mobility comments: PT initiated getting to EOB well; Needing mod handheld assist to pull to sit and use of bed pad to assist in squaring off at EOB  Transfers Overall transfer level: Needs assistance Equipment used: Rolling walker (2 wheeled) Transfers: Sit to/from Stand Sit to Stand: Mod assist         General transfer comment: mod assist to power-up , and physical assist to place R hand on RW  Ambulation/Gait Ambulation/Gait assistance: Min assist Ambulation Distance (Feet):  ([facility] Assistive device: Rolling  walker (2 wheeled) Gait Pattern/deviations: Shuffle     General Gait Details: Dependent on UE support; distance cut short due to imminent transport down to procedure (cath out)  Stairs            Wheelchair Mobility    Modified Rankin (Stroke Patients Only)       Balance Overall balance assessment: Needs assistance   Sitting balance-Leahy Scale: Fair     Standing balance support: Bilateral upper extremity supported Standing balance-Leahy Scale: Poor                               Pertinent Vitals/Pain      Home Living Family/patient expects to be discharged to:: Private residence Living Arrangements: Other relatives Available Help at Discharge: Family (pt states he has 24 hour assist; will need to be verified) Type of Home: House Home Access: Ramped entrance     Home Layout: One level Home Equipment: Walker - 4 wheels;Wheelchair - manual      Prior Function Level of Independence: Needs assistance   Gait / Transfers Assistance Needed: uses Rollator     Comments: Need to verify amount of assist available to pt     Hand Dominance        Extremity/Trunk Assessment   Upper Extremity Assessment: Generalized weakness           Lower Extremity Assessment: Generalized weakness         Communication   Communication: HOH  Cognition Arousal/Alertness: Awake/alert Behavior During  Therapy: WFL for tasks assessed/performed Overall Cognitive Status: Difficult to assess                      General Comments      Exercises        Assessment/Plan    PT Assessment Patient needs continued PT services  PT Diagnosis Difficulty walking;Generalized weakness   PT Problem List Decreased strength;Decreased range of motion;Decreased activity tolerance;Decreased balance;Decreased mobility;Decreased coordination;Decreased knowledge of use of DME;Pain  PT Treatment Interventions DME instruction;Gait training;Stair training;Functional  mobility training;Therapeutic activities;Therapeutic exercise;Balance training;Patient/family education;Cognitive remediation   PT Goals (Current goals can be found in the Care Plan section) Acute Rehab PT Goals Patient Stated Goal: wants to walk PT Goal Formulation: Patient unable to participate in goal setting Time For Goal Achievement: 09/12/14 Potential to Achieve Goals: Good    Frequency Min 3X/week   Barriers to discharge Decreased caregiver support Need more info: is pt at home alone often?    Co-evaluation               End of Session Equipment Utilized During Treatment: Gait belt Activity Tolerance: Patient tolerated treatment well Patient left: in bed (transporting downstairs) Nurse Communication: Mobility status         Time: 1210-1229 PT Time Calculation (min): 19 min   Charges:   PT Evaluation $Initial PT Evaluation Tier I: 1 Procedure PT Treatments $Therapeutic Activity: 8-22 mins   PT G Codes:          Van Clines Hamff 08/29/2014, 1:03 PM  Van Clines, [place]  Acute Rehabilitation Services Pager [phone] Office [phone]

## 2014-08-29 NOTE — Progress Notes (Signed)
Rehab Admissions Coordinator Note:  Patient was screened by Clois DupesBoyette, Ezechiel [place]bury Godwin for appropriateness for an Inpatient Acute Rehab Consult per PT recommendation.   At this time, we are recommending HH as I feel he will progress to be able to go home. On review of his previous admissions this summer, [facility] and refused SNF for he stated he had 24/7 assist at home. This needs to be clarified.   Clois DupesBoyette, Tamar Miano Godwin 08/29/2014, 1:36 PM  I can be reached at [phone].

## 2014-08-29 NOTE — Progress Notes (Signed)
Request for right tunneled IJ HD catheter removal with concern for infection. Successful removal of catheter with no immediate complications, Vaseline dressing applied.  Pattricia BossKoreen Rickia Freeburg PA-C Interventional Radiology  08/29/14  1:12 PM

## 2014-08-29 NOTE — Progress Notes (Addendum)
[facility] and [facility] chart reviewed.   PROGRESS NOTE  [name] VWU:[id number] DOB: Dec 31, 1933 DOA: 08/27/2014 PCP: Lisette AbuAMIN,ASIM, MD  Assessment/Plan: Cellulitis in right arm, bacteremia, sepsis:  -due to infected AVG -bacteremia, with enterococcus fecalis and Proteus mirabilis, which are reportedly sensitive to vancomycin and meropenem. [facility] Will get cx results faxed then discuss with ID. Repeat BC negative to date.  AVG and tunneled dialysis catheter out - continue IV vancomycin and meropenem   DM-II:  Lows on admission. Better now after decreasing insulin  ESRD-HD: T/T/S.   HTN:   -  continue home medications, including amlodipine, Coreg   GERD: continue PPI   Anemia: monitor  Code Status: full Family Communication: patient, no family at bedside Disposition Plan:    Consultants:  Renal  vascular  Procedures:  Removal of AVG and TDC  HPI/Subjective: No complaints  Objective: Filed Vitals:   08/29/14 1702  BP: 111/48  Pulse: 68  Temp: 97.6 F (36.4 C)  Resp: 18    Intake/Output Summary (Last 24 hours) at 08/29/14 1827 Last data filed at 08/29/14 1129  Gross per 24 hour  Intake    262 ml  Output   2000 ml  Net  -1738 ml   Filed Weights   08/28/14 2100 08/29/14 0659 08/29/14 1129  Weight: 86.9 kg (191 lb 9.3 oz) 88.9 kg (195 lb 15.8 oz) 87 kg (191 lb 12.8 oz)    Exam:   General:  Pleasant/cooperative- voice difficult to understand at times  Cardiovascular: rrr without MGR  Respiratory: no wheezing, rhonchi or rales  Abdomen: +Bs, soft, NT, ND  Musculoskeletal: right arm with redness and tenderness. Ace wrap CDI  Data Reviewed: Basic Metabolic Panel:  Recent Labs Lab 08/27/14 2243 08/28/14 0509 08/28/14 1708 08/29/14 0717  NA 136* 134*  --  133*  K 4.6 4.4  --  4.9  CL 93* 92*  --  91*  CO2 29 28  --  31  GLUCOSE 86 71  --  77  BUN 33* 39*  --  58*  CREATININE 4.45* 4.92*  --  6.79*  CALCIUM 8.4 8.4  --   8.2*  PHOS  --   --  4.1 4.5   Liver Function Tests:  Recent Labs Lab 08/27/14 2243 08/29/14 0717  AST 41*  --   ALT 27  --   ALKPHOS 332*  --   BILITOT 1.1  --   PROT 7.7  --   ALBUMIN 2.0* 1.9*   No results found for this basename: LIPASE, AMYLASE,  in the last 168 hours No results found for this basename: AMMONIA,  in the last 168 hours CBC:  Recent Labs Lab 08/27/14 2243 08/28/14 0509 08/29/14 0717  WBC 9.8 8.0 8.4  NEUTROABS 7.0  --   --   HGB 7.9* 8.9* 7.6*  HCT 23.3* 27.1* 23.2*  MCV 89.3 90.0 90.3  PLT PLATELET CLUMPS NOTED ON SMEAR, COUNT APPEARS ADEQUATE 190 198   Cardiac Enzymes: No results found for this basename: CKTOTAL, CKMB, CKMBINDEX, TROPONINI,  in the last 168 hours BNP (last 3 results) No results found for this basename: PROBNP,  in the last 8760 hours CBG:  Recent Labs Lab 08/28/14 1723 08/28/14 2142 08/29/14 0201 08/29/14 1217 08/29/14 1701  GLUCAP 141* 106* 107* 117* 123*    Recent Results (from the past 240 hour(s))  CULTURE, BLOOD (ROUTINE X 2)     Status: None   Collection Time  08/27/14 10:21 PM      Result Value Ref Range Status   Specimen Description BLOOD LEFT HAND   Final   Special Requests     Final   Value: BOTTLES DRAWN AEROBIC AND ANAEROBIC 5CC BLUE,4CC RED   Culture  Setup Time     Final   Value: 08/28/2014 04:13     Performed at [facility]   Culture     Final   Value:        BLOOD CULTURE RECEIVED NO GROWTH TO DATE CULTURE WILL BE HELD FOR 5 DAYS BEFORE ISSUING A FINAL NEGATIVE REPORT     Performed at [facility]   Report Status PENDING   Incomplete  CULTURE, BLOOD (ROUTINE X 2)     Status: None   Collection Time    08/27/14 10:43 PM      Result Value Ref Range Status   Specimen Description BLOOD LEFT HAND   Final   Special Requests BOTTLES DRAWN AEROBIC ONLY 5CC   Final   Culture  Setup Time     Final   Value: 08/28/2014 04:11     Performed at [facility]   Culture     Final    Value:        BLOOD CULTURE RECEIVED NO GROWTH TO DATE CULTURE WILL BE HELD FOR 5 DAYS BEFORE ISSUING A FINAL NEGATIVE REPORT     Performed at [facility]   Report Status PENDING   Incomplete  ANAEROBIC CULTURE     Status: None   Collection Time    08/28/14 10:32 AM      Result Value Ref Range Status   Specimen Description WOUND GRAFT   Final   Special Requests RIGHT UPPER ARM PERIGRAFT PT ON VANCO MEROPENEM   Final   Gram Stain     Final   Value: RARE WBC PRESENT,BOTH PMN AND MONONUCLEAR     NO SQUAMOUS EPITHELIAL CELLS SEEN     NO ORGANISMS SEEN     Performed at [facility]   Culture     Final   Value: NO ANAEROBES ISOLATED; CULTURE IN PROGRESS FOR 5 DAYS     Performed at [facility]   Report Status PENDING   Incomplete  WOUND CULTURE     Status: None   Collection Time    08/28/14 10:32 AM      Result Value Ref Range Status   Specimen Description WOUND GRAFT   Final   Special Requests RIGHT UPPER ARM PERIGRAFT PT ON VANCO MEROPENEM   Final   Gram Stain PENDING   Incomplete   Culture     Final   Value: NO GROWTH 1 DAY     Performed at [facility]   Report Status PENDING   Incomplete     Studies: No results found.  Scheduled Meds: . allopurinol  100 mg Oral Daily  . amLODipine  10 mg Oral Daily  . aspirin EC  81 mg Oral Daily  . atorvastatin  20 mg Oral q1800  . carvedilol  12.5 mg Oral BID WC  . chlorhexidine      . clopidogrel  75 mg Oral Daily  . darbepoetin  100 mcg Intravenous Q Wed-HD  . doxercalciferol  2 mcg Intravenous Q M,W,F-HD  . furosemide  120 mg Oral Daily  . heparin  5,000 Units Subcutaneous 3 times per day  . insulin aspart  0-9 Units Subcutaneous TID WC  .  insulin aspart protamine- aspart  15 Units Subcutaneous BID WC  . lidocaine      . meropenem (MERREM) IV  1 g Intravenous Q24H  . metoCLOPramide  5 mg Oral BID  . multivitamin  1 tablet Oral QHS  . nitroGLYCERIN  0.4 mg Transdermal Daily  . pantoprazole  40  mg Oral Daily  . sevelamer carbonate  1,600 mg Oral TID WC  . sodium bicarbonate  1,300 mg Oral BID  . sucralfate  1 g Oral TID  . vancomycin  1,000 mg Intravenous Once   Continuous Infusions: . sodium chloride     Antibiotics Given (last 72 hours)   Date/Time Action Medication Dose Rate   08/27/14 2254 Given   vancomycin (VANCOCIN) IVPB 1000 mg/200 mL premix 1,000 mg 200 mL/hr   08/28/14 2254 Given   meropenem (MERREM) 1 g in sodium chloride 0.9 % 100 mL IVPB 1 g 200 mL/hr     Time spent: 25  min  Clydene Burack L  [facility] Pager [phone]. If 7PM-7AM, please contact night-coverage at [email or web address], password [facility] 08/29/2014, 6:27 PM  LOS: 2 days

## 2014-08-30 ENCOUNTER — Encounter (HOSPITAL_COMMUNITY): Payer: Self-pay | Admitting: Vascular Surgery

## 2014-08-30 DIAGNOSIS — Z1611 Resistance to penicillins: Secondary | ICD-10-CM

## 2014-08-30 DIAGNOSIS — B952 Enterococcus as the cause of diseases classified elsewhere: Secondary | ICD-10-CM

## 2014-08-30 LAB — CBC
HCT: 23.3 % — ABNORMAL LOW (ref 39.0–52.0)
HEMOGLOBIN: 7.5 g/dL — AB (ref 13.0–17.0)
MCH: 29.2 pg (ref 26.0–34.0)
MCHC: 32.2 g/dL (ref 30.0–36.0)
MCV: 90.7 fL (ref 78.0–100.0)
PLATELETS: ADEQUATE 10*3/uL (ref 150–400)
RBC: 2.57 MIL/uL — ABNORMAL LOW (ref 4.22–5.81)
RDW: 14.8 % (ref 11.5–15.5)
WBC: 7.5 10*3/uL (ref 4.0–10.5)

## 2014-08-30 LAB — GLUCOSE, CAPILLARY
Glucose-Capillary: 84 mg/dL (ref 70–99)
Glucose-Capillary: 163 mg/dL — ABNORMAL HIGH (ref 70–99)
Glucose-Capillary: 176 mg/dL — ABNORMAL HIGH (ref 70–99)
Glucose-Capillary: 113 mg/dL — ABNORMAL HIGH (ref 70–99)

## 2014-08-30 MED ORDER — SODIUM CHLORIDE 0.9 % IV SOLN
1.5000 g | Freq: Two times a day (BID) | INTRAVENOUS | Status: DC
  Administered 2014-08-30 – 2014-09-04 (×9): 1.5 g via INTRAVENOUS
  Filled 2014-08-30 (×12): qty 1.5

## 2014-08-30 MED ORDER — DEXTROSE 5 % IV SOLN
1.0000 g | INTRAVENOUS | Status: DC
  Administered 2014-08-30: 1 g via INTRAVENOUS
  Filled 2014-08-30 (×2): qty 10

## 2014-08-30 NOTE — Progress Notes (Addendum)
[facility] and [facility] chart reviewed.   PROGRESS NOTE  [name] ZOX:[id number] DOB: 1934/09/23 DOA: 08/27/2014 PCP: Lisette Abu, MD  Assessment/Plan: Cellulitis in right arm, bacteremia, sepsis:  -due to infected AVG -bacteremia, with enterococcus fecalis and Proteus mirabilis. Culture and sensitivities received from [facility]. Drawn 9/29. Enterococcus Faecalis sensitive to erythromycin penicillin ampicillin vancomycin and linezolid, resistant to tetracycline. Proteus mirabilis sensitive to ciprofloxacin trimethoprim sulfa ampicillin sulbactam piperacillin/tazobactam gentamicin ertapenem meropenem, resistant to ceftriaxone, ampicillin, cefazolin, cefepime, aztreonam. Repeat cx here negative to date AVG and tunneled dialysis catheter out. For HD cath tomorrow per nephrology Have consult visit ID for recommendations. Patient is penicillin allergic. Will continue vancomycin for now pending ID evaluation. Will change meropenem to ciprofloxacin for the Proteus species.  DM-II:  Lows on admission. Better now after decreasing insulin  ESRD-HD: T/T/S.   HTN:   -  continue home medications, including amlodipine, Coreg   GERD: continue PPI   Anemia: monitor  Deconditionining:  PT eval  Code Status: full Family Communication: patient, no family at bedside Disposition Plan:    Consultants:  Renal  Vascular  ID  Procedures:  Removal of AVG and TDC  HPI/Subjective: Feeling better, stronger each day. Pain controlled. Appetite ok  Objective: Filed Vitals:   08/30/14 0945  BP: 161/67  Pulse: 73  Temp: 98.4 F (36.9 C)  Resp: 18    Intake/Output Summary (Last 24 hours) at 08/30/14 1202 Last data filed at 08/30/14 0848  Gross per 24 hour  Intake    420 ml  Output    100 ml  Net    320 ml   Filed Weights   08/28/14 2100 08/29/14 0659 08/29/14 1129  Weight: 86.9 kg (191 lb 9.3 oz) 88.9 kg (195 lb 15.8 oz) 87 kg (191 lb 12.8 oz)    Exam:   General:  Pleasant/cooperative alert  oriented  Cardiovascular: rrr without MGR  Respiratory: no wheezing, rhonchi or rales  Abdomen: +Bs, soft, NT, ND  Musculoskeletal: right arm erythema and warmth improving. Ace wrap CDI  Data Reviewed: Basic Metabolic Panel:  Recent Labs Lab 08/27/14 2243 08/28/14 0509 08/28/14 1708 08/29/14 0717  NA 136* 134*  --  133*  K 4.6 4.4  --  4.9  CL 93* 92*  --  91*  CO2 29 28  --  31  GLUCOSE 86 71  --  77  BUN 33* 39*  --  58*  CREATININE 4.45* 4.92*  --  6.79*  CALCIUM 8.4 8.4  --  8.2*  PHOS  --   --  4.1 4.5   Liver Function Tests:  Recent Labs Lab 08/27/14 2243 08/29/14 0717  AST 41*  --   ALT 27  --   ALKPHOS 332*  --   BILITOT 1.1  --   PROT 7.7  --   ALBUMIN 2.0* 1.9*   No results found for this basename: LIPASE, AMYLASE,  in the last 168 hours No results found for this basename: AMMONIA,  in the last 168 hours CBC:  Recent Labs Lab 08/27/14 2243 08/28/14 0509 08/29/14 0717 08/30/14 0630  WBC 9.8 8.0 8.4 7.5  NEUTROABS 7.0  --   --   --   HGB 7.9* 8.9* 7.6* 7.5*  HCT 23.3* 27.1* 23.2* 23.3*  MCV 89.3 90.0 90.3 90.7  PLT PLATELET CLUMPS NOTED ON SMEAR, COUNT APPEARS ADEQUATE 190 198 PLATELET CLUMPS NOTED ON SMEAR, COUNT APPEARS ADEQUATE   Cardiac Enzymes: No results found for this basename: CKTOTAL, CKMB, CKMBINDEX, TROPONINI,  in the last 168 hours BNP (last 3 results) No results found for this basename: PROBNP,  in the last 8760 hours CBG:  Recent Labs Lab 08/29/14 1217 08/29/14 1701 08/29/14 2126 08/30/14 0739 08/30/14 1125  GLUCAP 117* 123* 110* 84 163*    Recent Results (from the past 240 hour(s))  CULTURE, BLOOD (ROUTINE X 2)     Status: None   Collection Time    08/27/14 10:21 PM      Result Value Ref Range Status   Specimen Description BLOOD LEFT HAND   Final   Special Requests     Final   Value: BOTTLES DRAWN AEROBIC AND ANAEROBIC 5CC BLUE,4CC RED   Culture  Setup Time     Final   Value: 08/28/2014 04:13     Performed at  [facility]   Culture     Final   Value:        BLOOD CULTURE RECEIVED NO GROWTH TO DATE CULTURE WILL BE HELD FOR 5 DAYS BEFORE ISSUING A FINAL NEGATIVE REPORT     Performed at [facility]   Report Status PENDING   Incomplete  CULTURE, BLOOD (ROUTINE X 2)     Status: None   Collection Time    08/27/14 10:43 PM      Result Value Ref Range Status   Specimen Description BLOOD LEFT HAND   Final   Special Requests BOTTLES DRAWN AEROBIC ONLY 5CC   Final   Culture  Setup Time     Final   Value: 08/28/2014 04:11     Performed at [facility]   Culture     Final   Value:        BLOOD CULTURE RECEIVED NO GROWTH TO DATE CULTURE WILL BE HELD FOR 5 DAYS BEFORE ISSUING A FINAL NEGATIVE REPORT     Performed at [facility]   Report Status PENDING   Incomplete  ANAEROBIC CULTURE     Status: None   Collection Time    08/28/14 10:32 AM      Result Value Ref Range Status   Specimen Description WOUND GRAFT   Final   Special Requests RIGHT UPPER ARM PERIGRAFT PT ON VANCO MEROPENEM   Final   Gram Stain     Final   Value: RARE WBC PRESENT,BOTH PMN AND MONONUCLEAR     NO SQUAMOUS EPITHELIAL CELLS SEEN     NO ORGANISMS SEEN     Performed at [facility]   Culture     Final   Value: NO ANAEROBES ISOLATED; CULTURE IN PROGRESS FOR 5 DAYS     Performed at [facility]   Report Status PENDING   Incomplete  WOUND CULTURE     Status: None   Collection Time    08/28/14 10:32 AM      Result Value Ref Range Status   Specimen Description WOUND GRAFT   Final   Special Requests RIGHT UPPER ARM PERIGRAFT PT ON VANCO MEROPENEM   Final   Gram Stain PENDING   Incomplete   Culture     Final   Value: Culture reincubated for better growth     Performed at [facility]   Report Status PENDING   Incomplete     Studies: Ir Removal Tun Cv Cath W/o Fl  08/29/2014   CLINICAL DATA:  Bacteremia, request for right tunneled IJ HD catheter removal.  EXAM: REMOVAL  TUNNELED CENTRAL VENOUS CATHETER  PROCEDURE: The patient's right chest  and catheter was prepped and draped in a normal sterile fashion. Heparin was removed from both ports of catheter. 1% lidocaine was used for local anesthesia. Using gentle blunt dissection the cuff of the catheter was exposed and the catheter was removed in it's entirety. Pressure was held till hemostasis was obtained. A sterile dressing was applied. The patient tolerated the procedure well with no immediate complications.  COMPLICATIONS: None immediate.  IMPRESSION: Successful catheter removal as described above.  Read By:  Pattricia BossKoreen Morgan PA-C   Electronically Signed   By: Irish LackGlenn  Yamagata M.D.   On: 08/29/2014 13:18    Scheduled Meds: . allopurinol  100 mg Oral Daily  . amLODipine  10 mg Oral Daily  . aspirin EC  81 mg Oral Daily  . atorvastatin  20 mg Oral q1800  . carvedilol  12.5 mg Oral BID WC  . clopidogrel  75 mg Oral Daily  . darbepoetin  100 mcg Intravenous Q Wed-HD  . doxercalciferol  2 mcg Intravenous Q M,W,F-HD  . furosemide  120 mg Oral Daily  . heparin  5,000 Units Subcutaneous 3 times per day  . insulin aspart  0-9 Units Subcutaneous TID WC  . insulin aspart protamine- aspart  15 Units Subcutaneous BID WC  . meropenem (MERREM) IV  500 mg Intravenous Q24H  . metoCLOPramide  5 mg Oral BID  . multivitamin  1 tablet Oral QHS  . nitroGLYCERIN  0.4 mg Transdermal Daily  . pantoprazole  40 mg Oral Daily  . sevelamer carbonate  1,600 mg Oral TID WC  . sodium bicarbonate  1,300 mg Oral BID  . sucralfate  1 g Oral TID   Continuous Infusions: . sodium chloride     Antibiotics Given (last 72 hours)   Date/Time Action Medication Dose Rate   08/27/14 2254 Given   vancomycin (VANCOCIN) IVPB 1000 mg/200 mL premix 1,000 mg 200 mL/hr   08/28/14 2254 Given   meropenem (MERREM) 1 g in sodium chloride 0.9 % 100 mL IVPB 1 g 200 mL/hr   08/29/14 1958 Given   meropenem (MERREM) 1 g in sodium chloride 0.9 % 100 mL IVPB 1 g  200 mL/hr   08/29/14 1958 Given  [med not avail at sched time]   vancomycin (VANCOCIN) IVPB 1000 mg/200 mL premix 1,000 mg 200 mL/hr     Time spent: 35  min  [name]  [facility] Pager [phone]. If 7PM-7AM, please contact night-coverage at [email or web address], password [email or web address] 08/30/2014, 12:02 PM  LOS: 3 days

## 2014-08-30 NOTE — Progress Notes (Signed)
S:eating OK   Pain controlled O:BP 161/67  Pulse 73  Temp(Src) 98.4 F (36.9 C) (Oral)  Resp 18  Ht 5\' 8"  (1.727 m)  Wt 87 kg (191 lb 12.8 oz)  BMI 29.17 kg/m2  SpO2 96%  Intake/Output Summary (Last 24 hours) at 08/30/14 0952 Last data filed at 08/30/14 0848  Gross per 24 hour  Intake    420 ml  Output   2100 ml  Net  -1680 ml   Weight change: 0.1 kg (3.5 oz) AVW:UJWJXGen:awake and alert CVS:RRR Resp:few basilar crackles Abd: + BS NTND Ext: No edema.  Still with some erythema in RUA NEURO: CNI  No asterixis Rt perm cath removed   . allopurinol  100 mg Oral Daily  . amLODipine  10 mg Oral Daily  . aspirin EC  81 mg Oral Daily  . atorvastatin  20 mg Oral q1800  . carvedilol  12.5 mg Oral BID WC  . clopidogrel  75 mg Oral Daily  . darbepoetin  100 mcg Intravenous Q Wed-HD  . doxercalciferol  2 mcg Intravenous Q M,W,F-HD  . furosemide  120 mg Oral Daily  . heparin  5,000 Units Subcutaneous 3 times per day  . insulin aspart  0-9 Units Subcutaneous TID WC  . insulin aspart protamine- aspart  15 Units Subcutaneous BID WC  . meropenem (MERREM) IV  500 mg Intravenous Q24H  . metoCLOPramide  5 mg Oral BID  . multivitamin  1 tablet Oral QHS  . nitroGLYCERIN  0.4 mg Transdermal Daily  . pantoprazole  40 mg Oral Daily  . sevelamer carbonate  1,600 mg Oral TID WC  . sodium bicarbonate  1,300 mg Oral BID  . sucralfate  1 g Oral TID   Ir Removal Tun Cv Cath W/o Fl  08/29/2014   CLINICAL DATA:  Bacteremia, request for right tunneled IJ HD catheter removal.  EXAM: REMOVAL TUNNELED CENTRAL VENOUS CATHETER  PROCEDURE: The patient's right chest and catheter was prepped and draped in a normal sterile fashion. Heparin was removed from both ports of catheter. 1% lidocaine was used for local anesthesia. Using gentle blunt dissection the cuff of the catheter was exposed and the catheter was removed in it's entirety. Pressure was held till hemostasis was obtained. A sterile dressing was applied. The  patient tolerated the procedure well with no immediate complications.  COMPLICATIONS: None immediate.  IMPRESSION: Successful catheter removal as described above.  Read By:  Pattricia BossKoreen Morgan PA-C   Electronically Signed   By: Irish LackGlenn  Yamagata M.D.   On: 08/29/2014 13:18   BMET    Component Value Date/Time   NA 133* 08/29/2014 0717   K 4.9 08/29/2014 0717   CL 91* 08/29/2014 0717   CO2 31 08/29/2014 0717   GLUCOSE 77 08/29/2014 0717   BUN 58* 08/29/2014 0717   CREATININE 6.79* 08/29/2014 0717   CALCIUM 8.2* 08/29/2014 0717   GFRNONAA 7* 08/29/2014 0717   GFRAA 8* 08/29/2014 0717   CBC    Component Value Date/Time   WBC 7.5 08/30/2014 0630   RBC 2.57* 08/30/2014 0630   HGB 7.5* 08/30/2014 0630   HCT 23.3* 08/30/2014 0630   PLT PLATELET CLUMPS NOTED ON SMEAR, COUNT APPEARS ADEQUATE 08/30/2014 0630   MCV 90.7 08/30/2014 0630   MCH 29.2 08/30/2014 0630   MCHC 32.2 08/30/2014 0630   RDW 14.8 08/30/2014 0630   LYMPHSABS 1.7 08/27/2014 2243   MONOABS 0.9 08/27/2014 2243   EOSABS 0.2 08/27/2014 2243   BASOSABS 0.0 08/27/2014  2243     Assessment: 1. Enterococcus and proteus bacteremia.  Sp removal AVG and permcath 2. Anemia on aranesp 3. Sec HPTH on hectorol 4. HTN 5. DM  Plan: 1. HD in AM after perm cath placement 2. Need to finalize an AB regimen for outpt   [name]

## 2014-08-30 NOTE — Evaluation (Signed)
Occupational Therapy Evaluation Patient Details Name: [name] MRN: [id number] DOB: 1934-04-17 Today's Date: 08/30/2014    History of Present Illness HPI: [name] is a 80 y.o. male with past medical history of COPD, type 2 diabetes, hypertension, end-stage renal disease on hemodialysis, history of TIA, hyperlipidemia, congestive heart failure, who presents with right upper arm pain, sweating and fever. Perm HD cath out of right upper arm on 10/7 with plans for new one on 10/9   Clinical Impression   This 80 yo male admitted with above presents to acute OT with generalized weakness, decreased use of RUE, decreased balance, and decreased mobility all affecting his ability to care for himself as independently as he can. He will benefit from acute OT with follow up HHOT (unless progress slowly post new HD graft, then may need CIR).    Follow Up Recommendations  Home health OT    Equipment Recommendations  None recommended by OT       Precautions / Restrictions Precautions Precautions: Fall Restrictions Weight Bearing Restrictions: No      Mobility Bed Mobility Overal bed mobility: Needs Assistance Bed Mobility: Supine to Sit     Supine to sit: Min assist     General bed mobility comments: Pt kicked legs off of bed then reached with LUE for right bed rail and pulled himself forward he then needed A with pushing up with RUE to come up to a full sitting position  Transfers Overall transfer level: Needs assistance Equipment used: Rolling walker (2 wheeled) Transfers: Sit to/from Stand Sit to Stand: Min assist         General transfer comment: Rocking motion to come up to sit and pt pulled up on RW with LUE as he does at home with his rollator that has brakes    Balance Overall balance assessment: Needs assistance Sitting-balance support: Feet supported;No upper extremity supported Sitting balance-Leahy Scale: Fair     Standing balance support: Bilateral upper  extremity supported Standing balance-Leahy Scale: Poor                              ADL Overall ADL's : Needs assistance/impaired Eating/Feeding: Modified independent;Sitting (increased time due to decrease use of RUE)   Grooming: Minimal assistance;Sitting   Upper Body Bathing: Minimal assitance;Sitting   Lower Body Bathing: Moderate assistance (with min A sit<>stand)   Upper Body Dressing : Moderate assistance;Sitting   Lower Body Dressing: Moderate assistance (with min A sit<>stand)   Toilet Transfer: Minimal assistance;Ambulation;RW (Bed>door>back to recliner)   Toileting- Clothing Manipulation and Hygiene: Moderate assistance (with min A sit<.stand)                         Pertinent Vitals/Pain Pain Assessment: No/denies pain     Hand Dominance Left   Extremity/Trunk Assessment Upper Extremity Assessment Upper Extremity Assessment: RUE deficits/detail RUE Deficits / Details: "This arm didn't work very well even before all of this started with this arm and they had to take the graft out. He has limited movement both GM and FM, but does try to use if functionally with BADLs RUE Coordination: decreased fine motor;decreased gross motor   Lower Extremity Assessment Lower Extremity Assessment: Generalized weakness       Communication Communication Communication: HOH   Cognition Arousal/Alertness: Awake/alert Behavior During Therapy: WFL for tasks assessed/performed Overall Cognitive Status: Within Functional Limits for tasks assessed  Home Living Family/patient expects to be discharged to:: Private residence Living Arrangements: Other relatives Available Help at Discharge: Family;Available 24 hours/day Type of Home: House Home Access: Ramped entrance     Home Layout: One level     Bathroom Shower/Tub: Tub/shower unit;Curtain Shower/tub characteristics: Engineer, building services: Standard      Home Equipment: Environmental consultant - 4 wheels;Wheelchair - manual;Shower seat;Bedside commode          Prior Functioning/Environment Level of Independence: Needs assistance  Gait / Transfers Assistance Needed: uses Rollator ADL's / Homemaking Assistance Needed: Pt stated that he has prn A at home for B/D and usually has others help him especially on dialysis days. He says he also occassionally needs A for toileting and that others definitely A him with stepping in and out of tub.         OT Diagnosis: Generalized weakness;Hemiplegia non-dominant side   OT Problem List: Decreased strength;Decreased range of motion;Impaired balance (sitting and/or standing);Impaired UE functional use;Obesity   OT Treatment/Interventions: Self-care/ADL training;DME and/or AE instruction;Therapeutic exercise;Therapeutic activities;Balance training;Patient/family education    OT Goals(Current goals can be found in the care plan section) Acute Rehab OT Goals Patient Stated Goal: to go home once he is ready OT Goal Formulation: With patient Time For Goal Achievement: 09/13/14 Potential to Achieve Goals: Good  OT Frequency: Min 2X/week              End of Session Equipment Utilized During Treatment: Gait belt;Rolling walker Nurse Communication:  (Pt's son wants to be called)  Activity Tolerance: Patient tolerated treatment well Patient left: in chair;with call bell/phone within reach;with chair alarm set   Time: 1535-1600 OT Time Calculation (min): 25 min Charges:  OT General Charges $OT Visit: 1 Procedure OT Evaluation $Initial OT Evaluation Tier I: 1 Procedure OT Treatments $Self Care/Home Management : 8-22 mins  Evette Georges [phone] 08/30/2014, 4:18 PM

## 2014-08-30 NOTE — Progress Notes (Signed)
  Echocardiogram 2D Echocardiogram has been performed.  Arvil ChacoFoster, Tyjanae Bartek 08/30/2014, 3:36 PM

## 2014-08-30 NOTE — Progress Notes (Addendum)
  [facility] Progress Note  08/30/2014 11:14 AM 2 Days Post-Op  Subjective:  Having minor pain with right upper arm.    Filed Vitals:   08/30/14 0945  BP: 161/67  Pulse: 73  Temp: 98.4 F (36.9 C)  Resp: 18     Physical Exam: Incisions:  Right upper arm incision clean with sutures in place. Serosanguinous drainage on bandage. Erythema of right upper arm.  Extremities:  Palpable right radial pulse.   CBC    Component Value Date/Time   WBC 7.5 08/30/2014 0630   RBC 2.57* 08/30/2014 0630   HGB 7.5* 08/30/2014 0630   HCT 23.3* 08/30/2014 0630   PLT PLATELET CLUMPS NOTED ON SMEAR, COUNT APPEARS ADEQUATE 08/30/2014 0630   MCV 90.7 08/30/2014 0630   MCH 29.2 08/30/2014 0630   MCHC 32.2 08/30/2014 0630   RDW 14.8 08/30/2014 0630   LYMPHSABS 1.7 08/27/2014 2243   MONOABS 0.9 08/27/2014 2243   EOSABS 0.2 08/27/2014 2243   BASOSABS 0.0 08/27/2014 2243    BMET    Component Value Date/Time   NA 133* 08/29/2014 0717   K 4.9 08/29/2014 0717   CL 91* 08/29/2014 0717   CO2 31 08/29/2014 0717   GLUCOSE 77 08/29/2014 0717   BUN 58* 08/29/2014 0717   CREATININE 6.79* 08/29/2014 0717   CALCIUM 8.2* 08/29/2014 0717   GFRNONAA 7* 08/29/2014 0717   GFRAA 8* 08/29/2014 0717    INR    Component Value Date/Time   INR 1.17 08/27/2014 2243     Intake/Output Summary (Last 24 hours) at 08/30/14 1114 Last data filed at 08/30/14 0848  Gross per 24 hour  Intake    420 ml  Output   2100 ml  Net  -1680 ml     Assessment:  80 y.o. male is s/p:  1. Removal of infected right upper arm AV graft  2. Vein patch angioplasty of the right brachial artery   2 Days Post-Op  Plan: - Continue daily dressing changes.  - Incision is clean and intact with serosanguinous drainage.   - Right upper arm cellulitis. Wound culture with no growth to date. Continue antibiotics.  - OR tomorrow for tunneled dialysis catheter placement.  - NPO after midnight. Obtain consent.   Kimberly Trinh,  PA-C [facility] Office: [phone] Pager: [phone] 08/30/2014 11:14 AM  Agree with above.  Currently getting 2D Echo For Diatek tomorrow.  Jacilyn Sanpedro, MD, FACS Beeper [phone] 08/30/2014     

## 2014-08-30 NOTE — Consult Note (Signed)
[facility]     Reason for Consult: Enterococcal bacteremia    Referring Physician: Dr. Lendell CapriceSullivan  Principal Problem:   Cellulitis Active Problems:   ESRD needing dialysis   DM2 (diabetes mellitus, type 2)   HTN (hypertension)   Sepsis   Hypertension   CHF (congestive heart failure)   Peripheral vascular disease   GERD (gastroesophageal reflux disease)   Gout   Bacteremia   History of stroke   . allopurinol  100 mg Oral Daily  . amLODipine  10 mg Oral Daily  . aspirin EC  81 mg Oral Daily  . atorvastatin  20 mg Oral q1800  . carvedilol  12.5 mg Oral BID WC  . clopidogrel  75 mg Oral Daily  . darbepoetin  100 mcg Intravenous Q Wed-HD  . doxercalciferol  2 mcg Intravenous Q M,W,F-HD  . furosemide  120 mg Oral Daily  . heparin  5,000 Units Subcutaneous 3 times per day  . insulin aspart  0-9 Units Subcutaneous TID WC  . insulin aspart protamine- aspart  15 Units Subcutaneous BID WC  . metoCLOPramide  5 mg Oral BID  . multivitamin  1 tablet Oral QHS  . nitroGLYCERIN  0.4 mg Transdermal Daily  . pantoprazole  40 mg Oral Daily  . sevelamer carbonate  1,600 mg Oral TID WC  . sodium bicarbonate  1,300 mg Oral BID  . sucralfate  1 g Oral TID    Recommendations: Amp/sulbatam + ceftriaxone - will cover both organisms echo  Assessment: He has positive blood cultures 2/2 for ampicillin sensitive Enterococcus and amp/sulbactam sensitive Proteus.   Enterococcus can be treated with dual beta lactams.  Could consider cefazolin due to being on dialysis.   His penicillin allergy is listed as an intolerance, n/v and this was confirmed with the patient.  Not a true allergy.    Antibiotics: Vancomycin and meropenem  HPI: [name] is a 80 y.o. male with ESRD on dialysis, AVG, [facility], [facility] pain.  Noted right arm cellulitis at site of fistula which was placed 9/8 by Dr. Jettie BoozeField.  Subsequent blood cultures grew 2/2 Enterococcus amp sens  and Proteus.  Sensitivities reviewed in paper chart.  Has been on vancomycin and meropenem.  Feels much better now.  Had AVF removed 10/6. Getting access in am tomorrow.     Review of Systems: A comprehensive review of systems was negative.  Past Medical History  Diagnosis Date  . Hypertension   . CHF (congestive heart failure)   . Peripheral vascular disease   . Renal disease   . High cholesterol   . GERD (gastroesophageal reflux disease)   . Type II diabetes mellitus   . Arthritis     "all over"  . Chronic lower back pain   . Gout   . Shortness of breath   . Sleep apnea     does not  use cpap  . Stroke     family member states he's not had a stroke    History  Substance Use Topics  . Smoking status: Former Smoker    Quit date: 07/18/1974  . Smokeless tobacco: Never Used  . Alcohol Use: No    Family History  Problem Relation Age of Onset  . Stroke Mother   . Hypertension Father    Allergies  Allergen Reactions  . Hydrocodone Other (See Comments)    Upset stomach  . Penicillins Other (See Comments)    Upset  stomach    OBJECTIVE: Blood pressure 161/67, pulse 73, temperature 98.4 F (36.9 C), temperature source Oral, resp. rate 18, height 5\' 8"  (1.727 m), weight 191 lb 12.8 oz (87 kg), SpO2 96.00%. General: awak,e in bed, nad Skin: arm wrapped Lungs: CTA B Cor: RRR Abdomen: soft, nt, nd   Microbiology: Recent Results (from the past 240 hour(s))  CULTURE, BLOOD (ROUTINE X 2)     Status: None   Collection Time    08/27/14 10:21 PM      Result Value Ref Range Status   Specimen Description BLOOD LEFT HAND   Final   Special Requests     Final   Value: BOTTLES DRAWN AEROBIC AND ANAEROBIC 5CC BLUE,4CC RED   Culture  Setup Time     Final   Value: 08/28/2014 04:13     Performed at [facility]   Culture     Final   Value:        BLOOD CULTURE RECEIVED NO GROWTH TO DATE CULTURE WILL BE HELD FOR 5 DAYS BEFORE ISSUING A FINAL NEGATIVE REPORT      Performed at [facility]   Report Status PENDING   Incomplete  CULTURE, BLOOD (ROUTINE X 2)     Status: None   Collection Time    08/27/14 10:43 PM      Result Value Ref Range Status   Specimen Description BLOOD LEFT HAND   Final   Special Requests BOTTLES DRAWN AEROBIC ONLY 5CC   Final   Culture  Setup Time     Final   Value: 08/28/2014 04:11     Performed at [facility]   Culture     Final   Value:        BLOOD CULTURE RECEIVED NO GROWTH TO DATE CULTURE WILL BE HELD FOR 5 DAYS BEFORE ISSUING A FINAL NEGATIVE REPORT     Performed at [facility]   Report Status PENDING   Incomplete  ANAEROBIC CULTURE     Status: None   Collection Time    08/28/14 10:32 AM      Result Value Ref Range Status   Specimen Description WOUND GRAFT   Final   Special Requests RIGHT UPPER ARM PERIGRAFT PT ON VANCO MEROPENEM   Final   Gram Stain     Final   Value: RARE WBC PRESENT,BOTH PMN AND MONONUCLEAR     NO SQUAMOUS EPITHELIAL CELLS SEEN     NO ORGANISMS SEEN     Performed at [facility]   Culture     Final   Value: NO ANAEROBES ISOLATED; CULTURE IN PROGRESS FOR 5 DAYS     Performed at [facility]   Report Status PENDING   Incomplete  WOUND CULTURE     Status: None   Collection Time    08/28/14 10:32 AM      Result Value Ref Range Status   Specimen Description WOUND GRAFT   Final   Special Requests RIGHT UPPER ARM PERIGRAFT PT ON VANCO MEROPENEM   Final   Gram Stain PENDING   Incomplete   Culture     Final   Value: Culture reincubated for better growth     Performed at [facility]   Report Status PENDING   Incomplete    COMER, Molly Maduro, MD [email or web address] [phone] pager  [phone] cell 08/30/2014, 12:52 PM

## 2014-08-30 NOTE — Progress Notes (Signed)
[facility].  RichlandsEckelmann, Weeki [place] Gardensybill Eileen, [place].

## 2014-08-31 ENCOUNTER — Inpatient Hospital Stay (HOSPITAL_COMMUNITY): Payer: Medicare Other | Admitting: Anesthesiology

## 2014-08-31 ENCOUNTER — Inpatient Hospital Stay (HOSPITAL_COMMUNITY): Payer: Medicare Other

## 2014-08-31 ENCOUNTER — Encounter (HOSPITAL_COMMUNITY)
Admission: AD | Disposition: A | Discharge status: Skilled Nursing Facility | Payer: Self-pay | Source: Other Acute Inpatient Hospital | Attending: Internal Medicine

## 2014-08-31 ENCOUNTER — Encounter (HOSPITAL_COMMUNITY): Payer: Medicare Other | Admitting: Anesthesiology

## 2014-08-31 ENCOUNTER — Encounter (HOSPITAL_COMMUNITY): Payer: Self-pay | Admitting: Anesthesiology

## 2014-08-31 DIAGNOSIS — N185 Chronic kidney disease, stage 5: Secondary | ICD-10-CM

## 2014-08-31 DIAGNOSIS — Y832 Surgical operation with anastomosis, bypass or graft as the cause of abnormal reaction of the patient, or of later complication, without mention of misadventure at the time of the procedure: Secondary | ICD-10-CM

## 2014-08-31 DIAGNOSIS — T827XXD Infection and inflammatory reaction due to other cardiac and vascular devices, implants and grafts, subsequent encounter: Secondary | ICD-10-CM

## 2014-08-31 HISTORY — PX: INSERTION OF DIALYSIS CATHETER: SHX1324

## 2014-08-31 LAB — CBC
HEMATOCRIT: 25.3 % — AB (ref 39.0–52.0)
Hemoglobin: 8.1 g/dL — ABNORMAL LOW (ref 13.0–17.0)
MCH: 29.9 pg (ref 26.0–34.0)
MCHC: 32 g/dL (ref 30.0–36.0)
MCV: 93.4 fL (ref 78.0–100.0)
PLATELETS: 262 10*3/uL (ref 150–400)
RBC: 2.71 MIL/uL — ABNORMAL LOW (ref 4.22–5.81)
RDW: 14.6 % (ref 11.5–15.5)
WBC: 7.2 10*3/uL (ref 4.0–10.5)

## 2014-08-31 LAB — BASIC METABOLIC PANEL
ANION GAP: 14 (ref 5–15)
BUN: 36 mg/dL — ABNORMAL HIGH (ref 6–23)
CALCIUM: 8 mg/dL — AB (ref 8.4–10.5)
CO2: 27 meq/L (ref 19–32)
CREATININE: 6.36 mg/dL — AB (ref 0.50–1.35)
Chloride: 93 mEq/L — ABNORMAL LOW (ref 96–112)
GFR calc Af Amer: 9 mL/min — ABNORMAL LOW (ref >90)
GFR, EST NON AFRICAN AMERICAN: 7 mL/min — AB (ref >90)
Glucose, Bld: 85 mg/dL (ref 70–99)
Potassium: 4.2 mEq/L (ref 3.7–5.3)
Sodium: 134 mEq/L — ABNORMAL LOW (ref 137–147)

## 2014-08-31 LAB — GLUCOSE, CAPILLARY
Glucose-Capillary: 64 mg/dL — ABNORMAL LOW (ref 70–99)
Glucose-Capillary: 70 mg/dL (ref 70–99)
Glucose-Capillary: 89 mg/dL (ref 70–99)
Glucose-Capillary: 83 mg/dL (ref 70–99)

## 2014-08-31 LAB — SURGICAL PCR SCREEN
MRSA, PCR: NEGATIVE
Staphylococcus aureus: NEGATIVE

## 2014-08-31 LAB — RETICULOCYTES
RBC.: 2.71 MIL/uL — ABNORMAL LOW (ref 4.22–5.81)
Retic Count, Absolute: 32.5 10*3/uL (ref 19.0–186.0)
Retic Ct Pct: 1.2 % (ref 0.4–3.1)

## 2014-08-31 SURGERY — INSERTION OF DIALYSIS CATHETER
Anesthesia: Monitor Anesthesia Care | Site: Neck

## 2014-08-31 MED ORDER — LIDOCAINE HCL (CARDIAC) 20 MG/ML IV SOLN
INTRAVENOUS | Status: AC
  Filled 2014-08-31: qty 10

## 2014-08-31 MED ORDER — LIDOCAINE HCL (CARDIAC) 20 MG/ML IV SOLN
INTRAVENOUS | Status: DC | PRN
  Administered 2014-08-31 (×2): 50 mg via INTRAVENOUS

## 2014-08-31 MED ORDER — MIDAZOLAM HCL 2 MG/2ML IJ SOLN
INTRAMUSCULAR | Status: AC
  Filled 2014-08-31: qty 2

## 2014-08-31 MED ORDER — ONDANSETRON HCL 4 MG/2ML IJ SOLN
INTRAMUSCULAR | Status: AC
Start: 2014-08-31 — End: 2014-08-31
  Filled 2014-08-31: qty 2

## 2014-08-31 MED ORDER — PROPOFOL 10 MG/ML IV BOLUS
INTRAVENOUS | Status: DC | PRN
  Administered 2014-08-31 (×2): 20 mg via INTRAVENOUS

## 2014-08-31 MED ORDER — HEPARIN SODIUM (PORCINE) 1000 UNIT/ML IJ SOLN
INTRAMUSCULAR | Status: AC
  Filled 2014-08-31: qty 1

## 2014-08-31 MED ORDER — ONDANSETRON HCL 4 MG/2ML IJ SOLN
INTRAMUSCULAR | Status: DC | PRN
  Administered 2014-08-31: 4 mg via INTRAVENOUS

## 2014-08-31 MED ORDER — SODIUM CHLORIDE 0.9 % IR SOLN
Status: DC | PRN
  Administered 2014-08-31: 13:00:00

## 2014-08-31 MED ORDER — LIDOCAINE-EPINEPHRINE (PF) 1 %-1:200000 IJ SOLN
INTRAMUSCULAR | Status: AC
  Filled 2014-08-31: qty 10

## 2014-08-31 MED ORDER — DEXTROSE 50 % IV SOLN: 25.0000 mL | Freq: Once | INTRAVENOUS | Status: AC | PRN

## 2014-08-31 MED ORDER — INSULIN ASPART PROT & ASPART (70-30 MIX) 100 UNIT/ML ~~LOC~~ SUSP
10.0000 [IU] | Freq: Two times a day (BID) | SUBCUTANEOUS | Status: DC
  Administered 2014-09-01: 10 [IU] via SUBCUTANEOUS

## 2014-08-31 MED ORDER — HEPARIN SODIUM (PORCINE) 1000 UNIT/ML IJ SOLN
INTRAMUSCULAR | Status: DC | PRN
  Administered 2014-08-31: 4.6 mL via INTRAVENOUS

## 2014-08-31 MED ORDER — FENTANYL CITRATE 0.05 MG/ML IJ SOLN
INTRAMUSCULAR | Status: DC | PRN
  Administered 2014-08-31: 50 ug via INTRAVENOUS
  Administered 2014-08-31 (×2): 100 ug via INTRAVENOUS

## 2014-08-31 MED ORDER — OXYCODONE-ACETAMINOPHEN 5-325 MG PO TABS
ORAL_TABLET | ORAL | Status: AC
  Filled 2014-08-31: qty 2

## 2014-08-31 MED ORDER — SODIUM CHLORIDE 0.9 % IV SOLN
INTRAVENOUS | Status: DC | PRN
  Administered 2014-08-31: 13:00:00 via INTRAVENOUS

## 2014-08-31 MED ORDER — 0.9 % SODIUM CHLORIDE (POUR BTL) OPTIME
TOPICAL | Status: DC | PRN
  Administered 2014-08-31: 1000 mL

## 2014-08-31 MED ORDER — DEXTROSE 5 % IV SOLN
2.0000 g | Freq: Two times a day (BID) | INTRAVENOUS | Status: DC
  Administered 2014-08-31 – 2014-09-03 (×7): 2 g via INTRAVENOUS
  Filled 2014-08-31 (×9): qty 2

## 2014-08-31 MED ORDER — MIDAZOLAM HCL 5 MG/5ML IJ SOLN
INTRAMUSCULAR | Status: DC | PRN
  Administered 2014-08-31: 2 mg via INTRAVENOUS

## 2014-08-31 MED ORDER — DEXTROSE 50 % IV SOLN
INTRAVENOUS | Status: AC
  Administered 2014-08-31: 25 mL
  Filled 2014-08-31: qty 50

## 2014-08-31 MED ORDER — LIDOCAINE HCL (PF) 1 % IJ SOLN
INTRAMUSCULAR | Status: DC | PRN
  Administered 2014-08-31: 10 mL

## 2014-08-31 MED ORDER — FENTANYL CITRATE 0.05 MG/ML IJ SOLN
INTRAMUSCULAR | Status: AC
  Filled 2014-08-31: qty 5

## 2014-08-31 SURGICAL SUPPLY — 46 items
BAG DECANTER FOR FLEXI CONT (MISCELLANEOUS) ×3 IMPLANT
CATH CANNON HEMO 15F 50CM (CATHETERS) IMPLANT
CATH CANNON HEMO 15FR 19 (HEMODIALYSIS SUPPLIES) IMPLANT
CATH CANNON HEMO 15FR 23CM (HEMODIALYSIS SUPPLIES) ×3 IMPLANT
CATH CANNON HEMO 15FR 31CM (HEMODIALYSIS SUPPLIES) IMPLANT
CATH CANNON HEMO 15FR 32CM (HEMODIALYSIS SUPPLIES) ×3 IMPLANT
COVER PROBE W GEL 5X96 (DRAPES) IMPLANT
COVER SURGICAL LIGHT HANDLE (MISCELLANEOUS) ×3 IMPLANT
DECANTER SPIKE VIAL GLASS SM (MISCELLANEOUS) ×3 IMPLANT
DRAPE C-ARM 42X72 X-RAY (DRAPES) ×3 IMPLANT
DRAPE CHEST BREAST 15X10 FENES (DRAPES) ×3 IMPLANT
GAUZE SPONGE 2X2 8PLY STRL LF (GAUZE/BANDAGES/DRESSINGS) ×1 IMPLANT
GAUZE SPONGE 4X4 16PLY XRAY LF (GAUZE/BANDAGES/DRESSINGS) ×3 IMPLANT
GLOVE BIOGEL PI IND STRL 6.5 (GLOVE) ×1 IMPLANT
GLOVE BIOGEL PI IND STRL 7.0 (GLOVE) ×1 IMPLANT
GLOVE BIOGEL PI IND STRL 8 (GLOVE) ×1 IMPLANT
GLOVE BIOGEL PI INDICATOR 6.5 (GLOVE) ×2
GLOVE BIOGEL PI INDICATOR 7.0 (GLOVE) ×2
GLOVE BIOGEL PI INDICATOR 8 (GLOVE) ×2
GLOVE ECLIPSE 6.5 STRL STRAW (GLOVE) ×3 IMPLANT
GLOVE SS BIOGEL STRL SZ 7 (GLOVE) ×1 IMPLANT
GLOVE SUPERSENSE BIOGEL SZ 7 (GLOVE) ×2
GOWN STRL REUS W/ TWL LRG LVL3 (GOWN DISPOSABLE) ×2 IMPLANT
GOWN STRL REUS W/ TWL XL LVL3 (GOWN DISPOSABLE) ×1 IMPLANT
GOWN STRL REUS W/TWL LRG LVL3 (GOWN DISPOSABLE) ×4
GOWN STRL REUS W/TWL XL LVL3 (GOWN DISPOSABLE) ×2
KIT BASIN OR (CUSTOM PROCEDURE TRAY) ×3 IMPLANT
KIT ROOM TURNOVER OR (KITS) ×3 IMPLANT
NEEDLE 18GX1X1/2 (RX/OR ONLY) (NEEDLE) ×3 IMPLANT
NEEDLE 22X1 1/2 (OR ONLY) (NEEDLE) ×3 IMPLANT
NEEDLE HYPO 25GX1X1/2 BEV (NEEDLE) ×3 IMPLANT
NS IRRIG 1000ML POUR BTL (IV SOLUTION) ×3 IMPLANT
PACK SURGICAL SETUP 50X90 (CUSTOM PROCEDURE TRAY) ×3 IMPLANT
PAD ARMBOARD 7.5X6 YLW CONV (MISCELLANEOUS) ×6 IMPLANT
SOAP 2 % CHG 4 OZ (WOUND CARE) ×3 IMPLANT
SPONGE GAUZE 2X2 STER 10/PKG (GAUZE/BANDAGES/DRESSINGS) ×2
SUT ETHILON 3 0 PS 1 (SUTURE) ×3 IMPLANT
SUT VICRYL 4-0 PS2 18IN ABS (SUTURE) ×3 IMPLANT
SYR 20CC LL (SYRINGE) ×3 IMPLANT
SYR 5ML LL (SYRINGE) ×6 IMPLANT
SYR CONTROL 10ML LL (SYRINGE) ×3 IMPLANT
SYRINGE 10CC LL (SYRINGE) ×3 IMPLANT
TAPE CLOTH SURG 4X10 WHT LF (GAUZE/BANDAGES/DRESSINGS) ×3 IMPLANT
TOWEL OR 17X24 6PK STRL BLUE (TOWEL DISPOSABLE) ×3 IMPLANT
TOWEL OR 17X26 10 PK STRL BLUE (TOWEL DISPOSABLE) ×3 IMPLANT
WATER STERILE IRR 1000ML POUR (IV SOLUTION) ×3 IMPLANT

## 2014-08-31 NOTE — Op Note (Signed)
OPERATIVE REPORT  Date of Surgery: 08/27/2014 - 08/31/2014  Surgeon: Josephina GipJames Lawson, MD  Assistant: Nurse  Pre-op Diagnosis: End stage renal disease  Post-op Diagnosis: End stage renal disease  Procedure: Procedure(s): #1 bilateral ultrasound localization internal jugular veins #2 insertion of tunneled hemodialysis catheter via left IJ-Diateck -27 cm  Anesthesia: MAC  EBL: Minimal  Complications: None  Procedure Details: Patient was taken to the operating room placed in the Trendelenburg position the upper chest and neck were exposed. Both internal jugular veins were imaged using B-mode ultrasound both noted to be widely patent. An infected catheter had been removed from the right IJ about 3 days earlier so we selected the left side. After infiltration with 1% Xylocaine prepping and draping in routine sterile manner the left IJ was entered using a supraclavicular approach. Guidewire passed into the right atrium under fluoroscopic guidance. After dilating the track appropriately initially a 23 cm tunneled hemodialysis catheter was positioned but it was noted to be slightly short and would not make the curve down into the right atrium comfortably therefore I exchanged this for a 27 cm catheter which easily went into the right atrium. Was told peripherally secured with nylon sutures and the wound closed with Vicryl in a subcuticular fashion sterile dressing applied patient taken to recovery in stable condition for a chest x-ray   Josephina GipJames Lawson, MD 08/31/2014 1:25 PM

## 2014-08-31 NOTE — Anesthesia Preprocedure Evaluation (Addendum)
Anesthesia Evaluation  Patient identified by MRN, date of birth, ID band Patient awake    Reviewed: Allergy & Precautions, H&P , Patient's Chart, lab work & pertinent test results  Airway Mallampati: II      Dental  (+) Teeth Intact   Pulmonary former smoker,  breath sounds clear to auscultation        Cardiovascular hypertension, Rhythm:Regular Rate:Normal     Neuro/Psych    GI/Hepatic   Endo/Other  diabetes  Renal/GU      Musculoskeletal   Abdominal (+) + obese,   Peds  Hematology   Anesthesia Other Findings   Reproductive/Obstetrics                          Anesthesia Physical Anesthesia Plan  ASA: III  Anesthesia Plan:    Post-op Pain Management:    Induction:   Airway Management Planned:   Additional Equipment:   Intra-op Plan:   Post-operative Plan:   Informed Consent: I have reviewed the patients History and Physical, chart, labs and discussed the procedure including the risks, benefits and alternatives for the proposed anesthesia with the patient or authorized representative who has indicated his/her understanding and acceptance.   Dental advisory given  Plan Discussed with:   Anesthesia Plan Comments:        Anesthesia Quick Evaluation

## 2014-08-31 NOTE — Progress Notes (Signed)
Hypoglycemic Event  CBG: 64  Treatment: D50 IV 25 mL  Symptoms: Shaky  Follow-up CBG: Time: 10:30 CBG Result: 70  Treatment: D50 IV 25mL  Symptoms: none  Possible Reasons for Event: Inadequate meal intake  Comments/MD notified:Dr. Lendell CapriceSullivan notified     Noreene LarssonLarson, Bethan Adamek A  Remember to initiate Hypoglycemia Order Set & complete

## 2014-08-31 NOTE — Progress Notes (Addendum)
[facility] and [facility] chart reviewed.   PROGRESS NOTE  [name] RUE:[id number] DOB: [birth year] DOA: 08/27/2014 PCP: No primary provider on file.  Assessment/Plan: Cellulitis in right arm, bacteremia, sepsis:  -due to infected AVG -bacteremia, with enterococcus fecalis and Proteus mirabilis. Improving. TTE without vegetation. Clinically improving, never had abdominal/GI complaints. I think CT abd pelvis would be low yield. Had [facility] in today. Need final recs from ID, preferably something that could be given with HD, to keep patient from requiring tunneled CVL. Currently on ceftriaxone and unasyn.  Stable for discharge once abx regimen clarified.  Repeat cx here negative to date  AVG and tunneled dialysis catheter out. S/pTDC today. HD tomorrow.  DM-II:  Morning lows still. Will decrease insulin further  ESRD-HD: T/T/S.   HTN:   Controlled  GERD: continue PPI   Anemia: phlebotomy never drew labs. Will get now.  Deconditionining:  PT eval  Code Status: full Family Communication: patient, no family at bedside Disposition Plan: home with PT when stable   Consultants:  Renal  Vascular  ID  Procedures:  Removal of AVG and TDC  New TDC  HPI/Subjective: Feeling better, stronger each day. Pain controlled. Appetite ok  Objective: Filed Vitals:   08/31/14 1415  BP:   Pulse: 55  Temp: 97.5 F (36.4 C)  Resp: 12    Intake/Output Summary (Last 24 hours) at 08/31/14 1623 Last data filed at 08/31/14 1330  Gross per 24 hour  Intake    100 ml  Output      0 ml  Net    100 ml   Filed Weights   08/29/14 0659 08/29/14 1129 08/30/14 2100  Weight: 88.9 kg (195 lb 15.8 oz) 87 kg (191 lb 12.8 oz) 77.928 kg (171 lb 12.8 oz)    Exam:   General:  Pleasant/cooperative alert oriented  Cardiovascular: rrr without MGR  Respiratory: no wheezing, rhonchi or rales  Abdomen: +Bs, soft, NT, ND  Musculoskeletal: right arm erythema and warmth improving. Ace wrap CDI  Data  Reviewed: Basic Metabolic Panel:  Recent Labs Lab 08/27/14 2243 08/28/14 0509 08/28/14 1708 08/29/14 0717  NA 136* 134*  --  133*  K 4.6 4.4  --  4.9  CL 93* 92*  --  91*  CO2 29 28  --  31  GLUCOSE 86 71  --  77  BUN 33* 39*  --  58*  CREATININE 4.45* 4.92*  --  6.79*  CALCIUM 8.4 8.4  --  8.2*  PHOS  --   --  4.1 4.5   Liver Function Tests:  Recent Labs Lab 08/27/14 2243 08/29/14 0717  AST 41*  --   ALT 27  --   ALKPHOS 332*  --   BILITOT 1.1  --   PROT 7.7  --   ALBUMIN 2.0* 1.9*   No results found for this basename: LIPASE, AMYLASE,  in the last 168 hours No results found for this basename: AMMONIA,  in the last 168 hours CBC:  Recent Labs Lab 08/27/14 2243 08/28/14 0509 08/29/14 0717 08/30/14 0630  WBC 9.8 8.0 8.4 7.5  NEUTROABS 7.0  --   --   --   HGB 7.9* 8.9* 7.6* 7.5*  HCT 23.3* 27.1* 23.2* 23.3*  MCV 89.3 90.0 90.3 90.7  PLT PLATELET CLUMPS NOTED ON SMEAR, COUNT APPEARS ADEQUATE 190 198 PLATELET CLUMPS NOTED ON SMEAR, COUNT APPEARS ADEQUATE   Cardiac Enzymes: No results found for this basename: CKTOTAL, CKMB, CKMBINDEX, TROPONINI,  in the last  168 hours BNP (last 3 results) No results found for this basename: PROBNP,  in the last 8760 hours CBG:  Recent Labs Lab 08/30/14 2112 08/31/14 0739 08/31/14 1053 08/31/14 1207 08/31/14 1351  GLUCAP 113* 64* 70 89 83    Recent Results (from the past 240 hour(s))  CULTURE, BLOOD (ROUTINE X 2)     Status: None   Collection Time    08/27/14 10:21 PM      Result Value Ref Range Status   Specimen Description BLOOD LEFT HAND   Final   Special Requests     Final   Value: BOTTLES DRAWN AEROBIC AND ANAEROBIC 5CC BLUE,4CC RED   Culture  Setup Time     Final   Value: 08/28/2014 04:13     Performed at [facility]   Culture     Final   Value:        BLOOD CULTURE RECEIVED NO GROWTH TO DATE CULTURE WILL BE HELD FOR 5 DAYS BEFORE ISSUING A FINAL NEGATIVE REPORT     Performed at [facility]   Report Status PENDING   Incomplete  CULTURE, BLOOD (ROUTINE X 2)     Status: None   Collection Time    08/27/14 10:43 PM      Result Value Ref Range Status   Specimen Description BLOOD LEFT HAND   Final   Special Requests BOTTLES DRAWN AEROBIC ONLY 5CC   Final   Culture  Setup Time     Final   Value: 08/28/2014 04:11     Performed at [facility]   Culture     Final   Value:        BLOOD CULTURE RECEIVED NO GROWTH TO DATE CULTURE WILL BE HELD FOR 5 DAYS BEFORE ISSUING A FINAL NEGATIVE REPORT     Performed at [facility]   Report Status PENDING   Incomplete  ANAEROBIC CULTURE     Status: None   Collection Time    08/28/14 10:32 AM      Result Value Ref Range Status   Specimen Description WOUND GRAFT   Final   Special Requests RIGHT UPPER ARM PERIGRAFT PT ON VANCO MEROPENEM   Final   Gram Stain     Final   Value: RARE WBC PRESENT,BOTH PMN AND MONONUCLEAR     NO SQUAMOUS EPITHELIAL CELLS SEEN     NO ORGANISMS SEEN     Performed at [facility]   Culture     Final   Value: NO ANAEROBES ISOLATED; CULTURE IN PROGRESS FOR 5 DAYS     Performed at [facility]   Report Status PENDING   Incomplete  WOUND CULTURE     Status: None   Collection Time    08/28/14 10:32 AM      Result Value Ref Range Status   Specimen Description WOUND GRAFT   Final   Special Requests RIGHT UPPER ARM PERIGRAFT PT ON VANCO MEROPENEM   Final   Gram Stain PENDING   Incomplete   Culture     Final   Value: RARE ENTEROCOCCUS SPECIES     Performed at [facility]   Report Status PENDING   Incomplete  SURGICAL PCR SCREEN     Status: None   Collection Time    08/31/14  6:25 AM      Result Value Ref Range Status   MRSA, PCR NEGATIVE  NEGATIVE Final   Staphylococcus aureus NEGATIVE  NEGATIVE Final  Comment:            The Xpert SA Assay (FDA     approved for NASAL specimens     in patients over 15 years of age),     is one component of     a comprehensive  surveillance     program.  Test performance has     been validated by [employer] for patients greater     than or equal to 1 year old.     It is not intended     to diagnose infection nor to     guide or monitor treatment.     Studies: Dg Chest Port 1v Same Day  08/31/2014   CLINICAL DATA:  Evaluate port placement  EXAM: PORTABLE CHEST - 1 VIEW SAME DAY  COMPARISON:  None.  FINDINGS: Dual-lumen left-sided central venous catheter with the distal tip projecting over the right atrium and the more proximal tip over the SVC.  There is no focal parenchymal opacity, pleural effusion, or pneumothorax. There is stable cardiomegaly. The osseous structures are unremarkable.  IMPRESSION: Dual-lumen left-sided central venous catheter with the distal tip projecting over the right atrium and the more proximal tip over the SVC. No pneumothorax.   Electronically Signed   By: Elige Ko   On: 08/31/2014 14:46    Scheduled Meds: . allopurinol  100 mg Oral Daily  . amLODipine  10 mg Oral Daily  . ampicillin-sulbactam (UNASYN) IV  1.5 g Intravenous Q12H  . aspirin EC  81 mg Oral Daily  . atorvastatin  20 mg Oral q1800  . carvedilol  12.5 mg Oral BID WC  . cefTRIAXone (ROCEPHIN)  IV  2 g Intravenous Q12H  . clopidogrel  75 mg Oral Daily  . darbepoetin  100 mcg Intravenous Q Wed-HD  . doxercalciferol  2 mcg Intravenous Q M,W,F-HD  . furosemide  120 mg Oral Daily  . heparin  5,000 Units Subcutaneous 3 times per day  . insulin aspart  0-9 Units Subcutaneous TID WC  . insulin aspart protamine- aspart  15 Units Subcutaneous BID WC  . metoCLOPramide  5 mg Oral BID  . multivitamin  1 tablet Oral QHS  . nitroGLYCERIN  0.4 mg Transdermal Daily  . pantoprazole  40 mg Oral Daily  . sevelamer carbonate  1,600 mg Oral TID WC  . sodium bicarbonate  1,300 mg Oral BID  . sucralfate  1 g Oral TID   Continuous Infusions: . sodium chloride 10 mL/hr at 08/31/14 1205   Antibiotics Given (last 72 hours)    Date/Time Action Medication Dose Rate   08/28/14 2254 Given   meropenem (MERREM) 1 g in sodium chloride 0.9 % 100 mL IVPB 1 g 200 mL/hr   08/29/14 1958 Given   meropenem (MERREM) 1 g in sodium chloride 0.9 % 100 mL IVPB 1 g 200 mL/hr   08/29/14 1958 Given  [med not avail at sched time]   vancomycin (VANCOCIN) IVPB 1000 mg/200 mL premix 1,000 mg 200 mL/hr   08/30/14 1541 Given   cefTRIAXone (ROCEPHIN) 1 g in dextrose 5 % 50 mL IVPB 1 g 100 mL/hr   08/30/14 1541 Given   ampicillin-sulbactam (UNASYN) 1.5 g in sodium chloride 0.9 % 50 mL IVPB 1.5 g 100 mL/hr   08/31/14 0459 Given   ampicillin-sulbactam (UNASYN) 1.5 g in sodium chloride 0.9 % 50 mL IVPB 1.5 g 100 mL/hr     Time spent: 35  min  Christiane HaSULLIVAN,Dacen Frayre L  [facility] Pager [phone]. If 7PM-7AM, please contact night-coverage at [email or web address], password [email or web address] 08/31/2014, 4:23 PM  LOS: 4 days

## 2014-08-31 NOTE — H&P (View-Only) (Signed)
  [facility] Progress Note  08/30/2014 11:14 AM 2 Days Post-Op  Subjective:  Having minor pain with right upper arm.    Filed Vitals:   08/30/14 0945  BP: 161/67  Pulse: 73  Temp: 98.4 F (36.9 C)  Resp: 18     Physical Exam: Incisions:  Right upper arm incision clean with sutures in place. Serosanguinous drainage on bandage. Erythema of right upper arm.  Extremities:  Palpable right radial pulse.   CBC    Component Value Date/Time   WBC 7.5 08/30/2014 0630   RBC 2.57* 08/30/2014 0630   HGB 7.5* 08/30/2014 0630   HCT 23.3* 08/30/2014 0630   PLT PLATELET CLUMPS NOTED ON SMEAR, COUNT APPEARS ADEQUATE 08/30/2014 0630   MCV 90.7 08/30/2014 0630   MCH 29.2 08/30/2014 0630   MCHC 32.2 08/30/2014 0630   RDW 14.8 08/30/2014 0630   LYMPHSABS 1.7 08/27/2014 2243   MONOABS 0.9 08/27/2014 2243   EOSABS 0.2 08/27/2014 2243   BASOSABS 0.0 08/27/2014 2243    BMET    Component Value Date/Time   NA 133* 08/29/2014 0717   K 4.9 08/29/2014 0717   CL 91* 08/29/2014 0717   CO2 31 08/29/2014 0717   GLUCOSE 77 08/29/2014 0717   BUN 58* 08/29/2014 0717   CREATININE 6.79* 08/29/2014 0717   CALCIUM 8.2* 08/29/2014 0717   GFRNONAA 7* 08/29/2014 0717   GFRAA 8* 08/29/2014 0717    INR    Component Value Date/Time   INR 1.17 08/27/2014 2243     Intake/Output Summary (Last 24 hours) at 08/30/14 1114 Last data filed at 08/30/14 0848  Gross per 24 hour  Intake    420 ml  Output   2100 ml  Net  -1680 ml     Assessment:  80 y.o. male is s/p:  1. Removal of infected right upper arm AV graft  2. Vein patch angioplasty of the right brachial artery   2 Days Post-Op  Plan: - Continue daily dressing changes.  - Incision is clean and intact with serosanguinous drainage.   - Right upper arm cellulitis. Wound culture with no growth to date. Continue antibiotics.  - OR tomorrow for tunneled dialysis catheter placement.  - NPO after midnight. Obtain consent.   Maris BergerKimberly Brookelyn Gaynor,  PA-C [facility] Office: [phone] Pager: [phone] 08/30/2014 11:14 AM  Agree with above.  Currently getting 2D Echo For [employer] tomorrow.  Waverly Ferrarihristopher Dickson, MD, FACS Beeper [phone] 08/30/2014

## 2014-08-31 NOTE — Anesthesia Procedure Notes (Signed)
Procedure Name: MAC Date/Time: 08/31/2014 12:54 PM Performed by: Wray KearnsFOLEY, Ethelene Closser A Pre-anesthesia Checklist: Patient identified, Timeout performed, Emergency Drugs available, Suction available and Patient being monitored Patient Re-evaluated:Patient Re-evaluated prior to inductionOxygen Delivery Method: Nasal cannula Preoxygenation: Pre-oxygenation with 100% oxygen Intubation Type: IV induction Placement Confirmation: positive ETCO2 Dental Injury: Teeth and Oropharynx as per pre-operative assessment

## 2014-08-31 NOTE — Transfer of Care (Signed)
Immediate Anesthesia Transfer of Care Note  Patient: [name]  Procedure(s) Performed: Procedure(s): INSERTION OF DIALYSIS CATHETER LEFT INTERNAL JUGULAR VEIN (N/A)  Patient Location: PACU  Anesthesia Type:MAC  Level of Consciousness: awake, alert , oriented, patient cooperative and responds to stimulation  Airway & Oxygen Therapy: Patient Spontanous Breathing and Patient connected to nasal cannula oxygen  Post-op Assessment: Report given to PACU RN, Post -op Vital signs reviewed and stable, Patient moving all extremities and Patient moving all extremities X 4  Post vital signs: Reviewed and stable  Complications: No apparent anesthesia complications

## 2014-08-31 NOTE — Progress Notes (Addendum)
[facility]  Day # 2 unasyn and ceftriaxone  Subjective: No new complaints   Antibiotics:  Anti-infectives   Start     Dose/Rate Route Frequency Ordered Stop   08/30/14 2000  meropenem (MERREM) 500 mg in sodium chloride 0.9 % 50 mL IVPB  Status:  Discontinued     500 mg 100 mL/hr over 30 Minutes Intravenous Every 24 hours 08/29/14 2039 08/30/14 1222   08/30/14 1500  cefTRIAXone (ROCEPHIN) 1 g in dextrose 5 % 50 mL IVPB     1 g 100 mL/hr over 30 Minutes Intravenous Every 24 hours 08/30/14 1349     08/30/14 1500  ampicillin-sulbactam (UNASYN) 1.5 g in sodium chloride 0.9 % 50 mL IVPB     1.5 g 100 mL/hr over 30 Minutes Intravenous Every 12 hours 08/30/14 1349     08/29/14 1200  vancomycin (VANCOCIN) IVPB 1000 mg/200 mL premix     1,000 mg 200 mL/hr over 60 Minutes Intravenous  Once 08/28/14 1842 08/29/14 2058   08/28/14 2000  meropenem (MERREM) 1 g in sodium chloride 0.9 % 100 mL IVPB  Status:  Discontinued     1 g 200 mL/hr over 30 Minutes Intravenous Every 24 hours 08/28/14 1617 08/29/14 2039   08/27/14 2300  vancomycin (VANCOCIN) IVPB 1000 mg/200 mL premix     1,000 mg 200 mL/hr over 60 Minutes Intravenous  Once 08/27/14 2228 08/27/14 2354      Medications: Scheduled Meds: . allopurinol  100 mg Oral Daily  . amLODipine  10 mg Oral Daily  . ampicillin-sulbactam (UNASYN) IV  1.5 g Intravenous Q12H  . aspirin EC  81 mg Oral Daily  . atorvastatin  20 mg Oral q1800  . carvedilol  12.5 mg Oral BID WC  . cefTRIAXone (ROCEPHIN)  IV  1 g Intravenous Q24H  . clopidogrel  75 mg Oral Daily  . darbepoetin  100 mcg Intravenous Q Wed-HD  . doxercalciferol  2 mcg Intravenous Q M,W,F-HD  . furosemide  120 mg Oral Daily  . heparin  5,000 Units Subcutaneous 3 times per day  . insulin aspart  0-9 Units Subcutaneous TID WC  . insulin aspart protamine- aspart  15 Units Subcutaneous BID WC  . metoCLOPramide  5 mg Oral BID  . multivitamin  1 tablet Oral QHS    . nitroGLYCERIN  0.4 mg Transdermal Daily  . pantoprazole  40 mg Oral Daily  . sevelamer carbonate  1,600 mg Oral TID WC  . sodium bicarbonate  1,300 mg Oral BID  . sucralfate  1 g Oral TID   Continuous Infusions: . sodium chloride 10 mL/hr at 08/31/14 1205   PRN Meds:.acetaminophen, dextrose, loratadine, nitroGLYCERIN, oxyCODONE-acetaminophen, sorbitol, traMADol, white petrolatum, zolpidem    Objective: Weight change: -20 lb (-9.072 kg)  Intake/Output Summary (Last 24 hours) at 08/31/14 1538 Last data filed at 08/31/14 1330  Gross per 24 hour  Intake    100 ml  Output      0 ml  Net    100 ml   Blood pressure 135/62, pulse 55, temperature 97.5 F (36.4 C), temperature source Oral, resp. rate 12, height 5\' 8"  (1.727 m), weight 171 lb 12.8 oz (77.928 kg), SpO2 99.00%. Temp:  [97.4 F (36.3 C)-98.4 F (36.9 C)] 97.5 F (36.4 C) (10/09 1415) Pulse Rate:  [54-65] 55 (10/09 1415) Resp:  [12-19] 12 (10/09 1415) BP: (111-135)/(58-65) 135/62 mmHg (10/09 1400) SpO2:  [95 %-100 %] 99 % (10/09 1415) Weight:  [  171 lb 12.8 oz (77.928 kg)] 171 lb 12.8 oz (77.928 kg) (10/08 2100)  Physical Exam: General: Alert and awake, oriented x3, not in any acute distress. HEENT: anicteric sclera, pupils reactive to light and accommodation, EOMI CVS regular rate, normal r,  no murmur rubs or gallops Chest: clear to auscultation bilaterally, no wheezing, rales or rhonchi Abdomen: soft nontender, nondistended, normal bowel sounds, Extremities: AV fistula surgical site is clean Skin: no rashes Neuro: nonfocal  CBC:  Recent Labs Lab 08/27/14 2243 08/28/14 0509 08/29/14 0717 08/30/14 0630  HGB 7.9* 8.9* 7.6* 7.5*  HCT 23.3* 27.1* 23.2* 23.3*  PLT PLATELET CLUMPS NOTED ON SMEAR, COUNT APPEARS ADEQUATE 190 198 PLATELET CLUMPS NOTED ON SMEAR, COUNT APPEARS ADEQUATE  INR 1.17  --   --   --   APTT 38*  --   --   --      BMET  Recent Labs  08/29/14 0717  NA 133*  K 4.9  CL 91*  CO2 31   GLUCOSE 77  BUN 58*  CREATININE 6.79*  CALCIUM 8.2*     Liver Panel   Recent Labs  08/29/14 0717  ALBUMIN 1.9*       Sedimentation Rate No results found for this basename: ESRSEDRATE,  in the last 72 hours C-Reactive Protein No results found for this basename: CRP,  in the last 72 hours  Micro Results: Recent Results (from the past 720 hour(s))  CULTURE, BLOOD (ROUTINE X 2)     Status: None   Collection Time    08/27/14 10:21 PM      Result Value Ref Range Status   Specimen Description BLOOD LEFT HAND   Final   Special Requests     Final   Value: BOTTLES DRAWN AEROBIC AND ANAEROBIC 5CC BLUE,4CC RED   Culture  Setup Time     Final   Value: 08/28/2014 04:13     Performed at [facility]   Culture     Final   Value:        BLOOD CULTURE RECEIVED NO GROWTH TO DATE CULTURE WILL BE HELD FOR 5 DAYS BEFORE ISSUING A FINAL NEGATIVE REPORT     Performed at [facility]   Report Status PENDING   Incomplete  CULTURE, BLOOD (ROUTINE X 2)     Status: None   Collection Time    08/27/14 10:43 PM      Result Value Ref Range Status   Specimen Description BLOOD LEFT HAND   Final   Special Requests BOTTLES DRAWN AEROBIC ONLY 5CC   Final   Culture  Setup Time     Final   Value: 08/28/2014 04:11     Performed at [facility]   Culture     Final   Value:        BLOOD CULTURE RECEIVED NO GROWTH TO DATE CULTURE WILL BE HELD FOR 5 DAYS BEFORE ISSUING A FINAL NEGATIVE REPORT     Performed at [facility]   Report Status PENDING   Incomplete  ANAEROBIC CULTURE     Status: None   Collection Time    08/28/14 10:32 AM      Result Value Ref Range Status   Specimen Description WOUND GRAFT   Final   Special Requests RIGHT UPPER ARM PERIGRAFT PT ON VANCO MEROPENEM   Final   Gram Stain     Final   Value: RARE WBC PRESENT,BOTH PMN AND MONONUCLEAR     NO SQUAMOUS EPITHELIAL  CELLS SEEN     NO ORGANISMS SEEN     Performed at [facility]   Culture      Final   Value: NO ANAEROBES ISOLATED; CULTURE IN PROGRESS FOR 5 DAYS     Performed at [facility]   Report Status PENDING   Incomplete  WOUND CULTURE     Status: None   Collection Time    08/28/14 10:32 AM      Result Value Ref Range Status   Specimen Description WOUND GRAFT   Final   Special Requests RIGHT UPPER ARM PERIGRAFT PT ON VANCO MEROPENEM   Final   Gram Stain PENDING   Incomplete   Culture     Final   Value: RARE ENTEROCOCCUS SPECIES     Performed at [facility]   Report Status PENDING   Incomplete  SURGICAL PCR SCREEN     Status: None   Collection Time    08/31/14  6:25 AM      Result Value Ref Range Status   MRSA, PCR NEGATIVE  NEGATIVE Final   Staphylococcus aureus NEGATIVE  NEGATIVE Final   Comment:            The Xpert SA Assay (FDA     approved for NASAL specimens     in patients over 78 years of age),     is one component of     a comprehensive surveillance     program.  Test performance has     been validated by [facility] for patients greater     than or equal to 78 year old.     It is not intended     to diagnose infection nor to     guide or monitor treatment.    Studies/Results: Dg Chest Port 1v Same Day  08/31/2014   CLINICAL DATA:  Evaluate port placement  EXAM: PORTABLE CHEST - 1 VIEW SAME DAY  COMPARISON:  None.  FINDINGS: Dual-lumen left-sided central venous catheter with the distal tip projecting over the right atrium and the more proximal tip over the SVC.  There is no focal parenchymal opacity, pleural effusion, or pneumothorax. There is stable cardiomegaly. The osseous structures are unremarkable.  IMPRESSION: Dual-lumen left-sided central venous catheter with the distal tip projecting over the right atrium and the more proximal tip over the SVC. No pneumothorax.   Electronically Signed   By: Elige KoHetal  Patel   On: 08/31/2014 14:46      Assessment/Plan:  Principal Problem:   Cellulitis Active Problems:   ESRD needing  dialysis   DM2 (diabetes mellitus, type 2)   HTN (hypertension)   Sepsis   Hypertension   CHF (congestive heart failure)   Peripheral vascular disease   GERD (gastroesophageal reflux disease)   Gout   Bacteremia   History of stroke    [name] is a 80 y.o. male with polymicrobial bacteremia with anteroseptal hospice and Proteus , AVG infection sp removal AVG. Now with new access placed today on ampicillin and Ceftriaxone.  #1 Polymicrobial bacteremia with AVG infection:  --I would consider CT scan of abdomen and pelvis with IV and oral contrast to ensure No intra-abdominal source for current bacteremia   --for now continue high-dose Unasyn and high-dose ceftriaxone  --Given isolation of the enterococcus from his AV graft I think he should be treated as an endovascular infection and be given protracted antibiotics, in the case of the enterococcus he needs dual  therapy currently getting with a 2 beta lactams   --one could consider TEE to exclude endocarditis but if we give therapy for endocarditis duration only way it would change management at this point would be if it pushed us towards cardiothoracic surgery  #2 Screening: check HIV       LOS: 4 days   Acey LavCornelius [place] 08/31/2014, 3:38 PM

## 2014-08-31 NOTE — Anesthesia Postprocedure Evaluation (Signed)
  Anesthesia Post-op Note  Patient: [name]  Procedure(s) Performed: Procedure(s): INSERTION OF DIALYSIS CATHETER LEFT INTERNAL JUGULAR VEIN (N/A)  Patient Location: PACU  Anesthesia Type:MAC  Level of Consciousness: awake  Airway and Oxygen Therapy: Patient Spontanous Breathing and Patient connected to nasal cannula oxygen  Post-op Pain: mild  Post-op Assessment: Post-op Vital signs reviewed, Patient's Cardiovascular Status Stable, Respiratory Function Stable, Patent Airway and No signs of Nausea or vomiting  Post-op Vital Signs: stable  Last Vitals:  Filed Vitals:   08/31/14 1415  BP:   Pulse: 55  Temp: 36.4 C  Resp: 12    Complications: No apparent anesthesia complications

## 2014-08-31 NOTE — Procedures (Signed)
Pt seen on HD BFR 300.  Ap 130  VP 90.  He is normally TTS so will plan shorter run tomorrow to get back on schedule in anticipation of DC over the weekend.

## 2014-08-31 NOTE — Progress Notes (Signed)
Physical Therapy Treatment Patient Details Name: Dessa PhiStuart Keen MRN: [id number] DOB: 05-26-1934 Today's Date: 08/31/2014    History of Present Illness HPI: Dessa PhiStuart Keen is a 80 y.o. male with past medical history of COPD, type 2 diabetes, hypertension, end-stage renal disease on hemodialysis, history of TIA, hyperlipidemia, congestive heart failure, who presents with right upper arm pain, sweating and fever. Perm HD cath out of right upper arm on 10/7 with plans for new one on 10/9    PT Comments    Patient progressing with ambulation in hall today.  Feels weak in trunk and high fall risk with fatigue even with walker.  Has had falls with injury to right UE per patient report.  Now unable to actively lift arm so  PROM performed today.  Will be safe at home with assist which seems intermittent so remains fall risk, but likely not far from baseline.  Needs HHPT for now.  Will assess again post HD cath placement.  Follow Up Recommendations  Home health PT;Supervision/Assistance - 24 hour     Equipment Recommendations  Rolling walker with 5" wheels;3in1 (PT)    Recommendations for Other Services       Precautions / Restrictions Precautions Precautions: Fall Precaution Comments: h/o  R UE fracture with falls Restrictions Weight Bearing Restrictions: No    Mobility  Bed Mobility               General bed mobility comments: up in chair with nurse tech who reports she supervised only  Transfers Overall transfer level: Needs assistance Equipment used: Rolling walker (2 wheeled) Transfers: Sit to/from Stand Sit to Stand: Supervision         General transfer comment: cues for out at edge and increased time to scoot forward, heavy UE use to power up  Ambulation/Gait Ambulation/Gait assistance: Min guard;Supervision Ambulation Distance (Feet): 85 Feet Assistive device: Rolling walker (2 wheeled) Gait Pattern/deviations: Shuffle;Step-through pattern;Trunk flexed     General  Gait Details: increasing trunk flexion with distance, reports back soft tissue weakness; reports does better with his rollator   Stairs            Wheelchair Mobility    Modified Rankin (Stroke Patients Only)       Balance Overall balance assessment: Needs assistance   Sitting balance-Leahy Scale: Good     Standing balance support: Bilateral upper extremity supported Standing balance-Leahy Scale: Poor Standing balance comment: due to trunk weakness                    Cognition Arousal/Alertness: Awake/alert Behavior During Therapy: WFL for tasks assessed/performed Overall Cognitive Status: Within Functional Limits for tasks assessed                      Exercises General Exercises - Upper Extremity Shoulder Flexion: PROM;Right;10 reps Shoulder ADduction: Right Shoulder Horizontal ABduction: PROM;10 reps;Seated Shoulder Horizontal ADduction: PROM;Right;10 reps;Seated General Exercises - Lower Extremity Long Arc Quad: AROM;10 reps;Both;Seated (5 second hold) Hip Flexion/Marching: AROM;Both;10 reps;Seated Toe Raises: AROM;Both;10 reps;Seated Heel Raises: AROM;Both;10 reps;Seated    General Comments        Pertinent Vitals/Pain Pain Assessment: No/denies pain (currently no pain, occasional abdominal pain with deep inspiration)    Home Living                      Prior Function            PT Goals (current goals can now be found in  the care plan section) Progress towards PT goals: Progressing toward goals    Frequency  Min 3X/week    PT Plan Current plan remains appropriate    Co-evaluation             End of Session Equipment Utilized During Treatment: Gait belt Activity Tolerance: Patient tolerated treatment well Patient left: in chair;with chair alarm set;with call bell/phone within reach     Time: 0900-0924 PT Time Calculation (min): 24 min  Charges:  $Gait Training: 8-22 mins $Therapeutic Exercise: 8-22  mins                    G Codes:      WYNN,CYNDI 08/31/2014, 10:10 AM

## 2014-08-31 NOTE — Interval H&P Note (Signed)
History and Physical Interval Note:  08/31/2014 12:15 PM  [name]  has presented today for surgery, with the diagnosis of End stage renal disease  The various methods of treatment have been discussed with the patient and family. After consideration of risks, benefits and other options for treatment, the patient has consented to  Procedure(s): INSERTION OF DIALYSIS CATHETER (N/A) as a surgical intervention .  The patient's history has been reviewed, patient examined, no change in status, stable for surgery.  I have reviewed the patient's chart and labs.  Questions were answered to the patient's satisfaction.     Josephina GipLAWSON, JAMES

## 2014-08-31 NOTE — Progress Notes (Signed)
ANTIBIOTIC CONSULT NOTE - FOLLOW UP  Pharmacy Consult for Vancomycin + Meropenem --> to Rocephin and Unasyn Indication: Enterococcus + Proteus Bacteremia  Allergies  Allergen Reactions  . Hydrocodone Other (See Comments)    Upset stomach  . Penicillins Other (See Comments)    Upset stomach   Labs:  Recent Labs  08/29/14 0717 08/30/14 0630  WBC 8.4 7.5  HGB 7.6* 7.5*  PLT 198 PLATELET CLUMPS NOTED ON SMEAR, COUNT APPEARS ADEQUATE  CREATININE 6.79*  --    Estimated Creatinine Clearance: 8.7 ml/min (by C-G formula based on Cr of 6.79).  Recent Labs  08/28/14 1708  VANCORANDOM 23.4     Microbiology: Recent Results (from the past 720 hour(s))  CULTURE, BLOOD (ROUTINE X 2)     Status: None   Collection Time    08/27/14 10:21 PM      Result Value Ref Range Status   Specimen Description BLOOD LEFT HAND   Final   Special Requests     Final   Value: BOTTLES DRAWN AEROBIC AND ANAEROBIC 5CC BLUE,4CC RED   Culture  Setup Time     Final   Value: 08/28/2014 04:13     Performed at [facility]   Culture     Final   Value:        BLOOD CULTURE RECEIVED NO GROWTH TO DATE CULTURE WILL BE HELD FOR 5 DAYS BEFORE ISSUING A FINAL NEGATIVE REPORT     Performed at [facility]   Report Status PENDING   Incomplete  CULTURE, BLOOD (ROUTINE X 2)     Status: None   Collection Time    08/27/14 10:43 PM      Result Value Ref Range Status   Specimen Description BLOOD LEFT HAND   Final   Special Requests BOTTLES DRAWN AEROBIC ONLY 5CC   Final   Culture  Setup Time     Final   Value: 08/28/2014 04:11     Performed at [facility]   Culture     Final   Value:        BLOOD CULTURE RECEIVED NO GROWTH TO DATE CULTURE WILL BE HELD FOR 5 DAYS BEFORE ISSUING A FINAL NEGATIVE REPORT     Performed at [facility]   Report Status PENDING   Incomplete  ANAEROBIC CULTURE     Status: None   Collection Time    08/28/14 10:32 AM      Result Value Ref Range Status   Specimen Description WOUND GRAFT   Final   Special Requests RIGHT UPPER ARM PERIGRAFT PT ON VANCO MEROPENEM   Final   Gram Stain     Final   Value: RARE WBC PRESENT,BOTH PMN AND MONONUCLEAR     NO SQUAMOUS EPITHELIAL CELLS SEEN     NO ORGANISMS SEEN     Performed at [facility]   Culture     Final   Value: NO ANAEROBES ISOLATED; CULTURE IN PROGRESS FOR 5 DAYS     Performed at [facility]   Report Status PENDING   Incomplete  WOUND CULTURE     Status: None   Collection Time    08/28/14 10:32 AM      Result Value Ref Range Status   Specimen Description WOUND GRAFT   Final   Special Requests RIGHT UPPER ARM PERIGRAFT PT ON VANCO MEROPENEM   Final   Gram Stain PENDING   Incomplete   Culture     Final  Value: RARE ENTEROCOCCUS SPECIES     Performed at [facility]   Report Status PENDING   Incomplete  SURGICAL PCR SCREEN     Status: None   Collection Time    08/31/14  6:25 AM      Result Value Ref Range Status   MRSA, PCR NEGATIVE  NEGATIVE Final   Staphylococcus aureus NEGATIVE  NEGATIVE Final   Comment:            The Xpert SA Assay (FDA     approved for NASAL specimens     in patients over 78 years of age),     is one component of     a comprehensive surveillance     program.  Test performance has     been validated by [facility] for patients greater     than or equal to 444 year old.     It is not intended     to diagnose infection nor to     guide or monitor treatment.    Anti-infectives   Start     Dose/Rate Route Frequency Ordered Stop   08/30/14 2000  meropenem (MERREM) 500 mg in sodium chloride 0.9 % 50 mL IVPB  Status:  Discontinued     500 mg 100 mL/hr over 30 Minutes Intravenous Every 24 hours 08/29/14 2039 08/30/14 1222   08/30/14 1500  cefTRIAXone (ROCEPHIN) 1 g in dextrose 5 % 50 mL IVPB     1 g 100 mL/hr over 30 Minutes Intravenous Every 24 hours 08/30/14 1349     08/30/14 1500  ampicillin-sulbactam (UNASYN) 1.5 g in sodium  chloride 0.9 % 50 mL IVPB     1.5 g 100 mL/hr over 30 Minutes Intravenous Every 12 hours 08/30/14 1349     08/29/14 1200  vancomycin (VANCOCIN) IVPB 1000 mg/200 mL premix     1,000 mg 200 mL/hr over 60 Minutes Intravenous  Once 08/28/14 1842 08/29/14 2058   08/28/14 2000  meropenem (MERREM) 1 g in sodium chloride 0.9 % 100 mL IVPB  Status:  Discontinued     1 g 200 mL/hr over 30 Minutes Intravenous Every 24 hours 08/28/14 1617 08/29/14 2039   08/27/14 2300  vancomycin (VANCOCIN) IVPB 1000 mg/200 mL premix     1,000 mg 200 mL/hr over 60 Minutes Intravenous  Once 08/27/14 2228 08/27/14 2354      Assessment: 80 [facility] management of AVG infection. The patient continues on Vancomycin + Meropenem for Enterococcus + Proteus bacteremia [facility]. The patient's AVG was removed and is to be replaced  Antibiotics now changed to Unasyn and Rocephin with sensitivities now available. [facility] for enterococcus Repeat blood cultures negative to date  Goal of Therapy:  Appropriate antibiotic dosing  Plan:  1) Unasyn 1.5 grams iv Q 12 hours 2) Rocephin 1 gram iv Q 24 hours 3) Pharmacy will sign off -- re-consult if needed  Thank you. Okey RegalLisa Sabriah Hobbins, PharmD [phone] 08/31/2014 8:48 AM

## 2014-09-01 LAB — IRON AND TIBC
IRON: 29 ug/dL — AB (ref 42–135)
Saturation Ratios: 23 % (ref 20–55)
TIBC: 128 ug/dL — ABNORMAL LOW (ref 215–435)
UIBC: 99 ug/dL — AB (ref 125–400)

## 2014-09-01 LAB — GLUCOSE, CAPILLARY
Glucose-Capillary: 120 mg/dL — ABNORMAL HIGH (ref 70–99)
Glucose-Capillary: 79 mg/dL (ref 70–99)
Glucose-Capillary: 133 mg/dL — ABNORMAL HIGH (ref 70–99)
Glucose-Capillary: 73 mg/dL (ref 70–99)

## 2014-09-01 LAB — WOUND CULTURE

## 2014-09-01 LAB — VITAMIN B12: Vitamin B-12: 916 pg/mL — ABNORMAL HIGH (ref 211–911)

## 2014-09-01 LAB — FERRITIN: FERRITIN: 1430 ng/mL — AB (ref 22–322)

## 2014-09-01 LAB — HIV ANTIBODY (ROUTINE TESTING W REFLEX): HIV 1&2 Ab, 4th Generation: NONREACTIVE

## 2014-09-01 LAB — HEPATITIS C ANTIBODY (REFLEX): HCV Ab: NEGATIVE

## 2014-09-01 LAB — FOLATE: Folate: 13.6 ng/mL

## 2014-09-01 MED ORDER — INSULIN ASPART PROT & ASPART (70-30 MIX) 100 UNIT/ML ~~LOC~~ SUSP
8.0000 [IU] | Freq: Two times a day (BID) | SUBCUTANEOUS | Status: DC
  Administered 2014-09-01 – 2014-09-04 (×6): 8 [IU] via SUBCUTANEOUS
  Filled 2014-09-01: qty 10

## 2014-09-01 NOTE — Progress Notes (Signed)
[facility]  Day # 3 unasyn and ceftriaxone  Subjective: No new complaints   Antibiotics:  Anti-infectives   Start     Dose/Rate Route Frequency Ordered Stop   08/31/14 1700  cefTRIAXone (ROCEPHIN) 2 g in dextrose 5 % 50 mL IVPB     2 g 100 mL/hr over 30 Minutes Intravenous Every 12 hours 08/31/14 1543     08/30/14 2000  meropenem (MERREM) 500 mg in sodium chloride 0.9 % 50 mL IVPB  Status:  Discontinued     500 mg 100 mL/hr over 30 Minutes Intravenous Every 24 hours 08/29/14 2039 08/30/14 1222   08/30/14 1500  cefTRIAXone (ROCEPHIN) 1 g in dextrose 5 % 50 mL IVPB  Status:  Discontinued     1 g 100 mL/hr over 30 Minutes Intravenous Every 24 hours 08/30/14 1349 08/31/14 1543   08/30/14 1500  ampicillin-sulbactam (UNASYN) 1.5 g in sodium chloride 0.9 % 50 mL IVPB     1.5 g 100 mL/hr over 30 Minutes Intravenous Every 12 hours 08/30/14 1349     08/29/14 1200  vancomycin (VANCOCIN) IVPB 1000 mg/200 mL premix     1,000 mg 200 mL/hr over 60 Minutes Intravenous  Once 08/28/14 1842 08/29/14 2058   08/28/14 2000  meropenem (MERREM) 1 g in sodium chloride 0.9 % 100 mL IVPB  Status:  Discontinued     1 g 200 mL/hr over 30 Minutes Intravenous Every 24 hours 08/28/14 1617 08/29/14 2039   08/27/14 2300  vancomycin (VANCOCIN) IVPB 1000 mg/200 mL premix     1,000 mg 200 mL/hr over 60 Minutes Intravenous  Once 08/27/14 2228 08/27/14 2354      Medications: Scheduled Meds: . allopurinol  100 mg Oral Daily  . amLODipine  10 mg Oral Daily  . ampicillin-sulbactam (UNASYN) IV  1.5 g Intravenous Q12H  . aspirin EC  81 mg Oral Daily  . atorvastatin  20 mg Oral q1800  . carvedilol  12.5 mg Oral BID WC  . cefTRIAXone (ROCEPHIN)  IV  2 g Intravenous Q12H  . clopidogrel  75 mg Oral Daily  . darbepoetin  100 mcg Intravenous Q Wed-HD  . doxercalciferol  2 mcg Intravenous Q M,W,F-HD  . furosemide  120 mg Oral Daily  . heparin  5,000 Units Subcutaneous 3 times per day  .  insulin aspart protamine- aspart  8 Units Subcutaneous BID WC  . metoCLOPramide  5 mg Oral BID  . multivitamin  1 tablet Oral QHS  . nitroGLYCERIN  0.4 mg Transdermal Daily  . pantoprazole  40 mg Oral Daily  . sevelamer carbonate  1,600 mg Oral TID WC  . sodium bicarbonate  1,300 mg Oral BID  . sucralfate  1 g Oral TID   Continuous Infusions: . sodium chloride 10 mL/hr at 08/31/14 1205   PRN Meds:.acetaminophen, loratadine, nitroGLYCERIN, oxyCODONE-acetaminophen, sorbitol, traMADol, white petrolatum, zolpidem    Objective: Weight change: 46 lb 3.8 oz (20.972 kg)  Intake/Output Summary (Last 24 hours) at 09/01/14 1642 Last data filed at 09/01/14 0952  Gross per 24 hour  Intake    230 ml  Output   4052 ml  Net  -3822 ml   Blood pressure 141/65, pulse 56, temperature 98.6 F (37 C), temperature source Oral, resp. rate 16, height 5\' 8"  (1.727 m), weight 205 lb 4 oz (93.1 kg), SpO2 96.00%. Temp:  [97.4 F (36.3 C)-98.7 F (37.1 C)] 98.6 F (37 C) (10/10 0952) Pulse Rate:  [53-67] 56 (10/10  [id number]) Resp:  [14-18] 16 (10/10 0952) BP: (119-149)/(57-74) 141/65 mmHg (10/10 0952) SpO2:  [96 %-99 %] 96 % (10/10 0952) Weight:  [205 lb 4 oz (93.1 kg)-218 lb 0.6 oz (98.9 kg)] 205 lb 4 oz (93.1 kg) (10/10 [id number])  Physical Exam: General: Alert and awake, oriented x3, not in any acute distress, he does seem a bit HOH to me today. HEENT: anicteric sclera, pupils reactive to light and accommodation, EOMI CVS regular rate, normal r,  no murmur rubs or gallops Chest: clear to auscultation bilaterally, no wheezing, rales or rhonchi Abdomen: soft nontender, nondistended, normal bowel sounds, Extremities: AV fistula surgical site is clean Skin: no rashes Neuro: nonfocal  CBC:  Recent Labs Lab 08/27/14 2243 08/28/14 0509 08/29/14 0717 08/30/14 0630 08/31/14 1630  HGB 7.9* 8.9* 7.6* 7.5* 8.1*  HCT 23.3* 27.1* 23.2* 23.3* 25.3*  PLT PLATELET CLUMPS NOTED ON SMEAR, COUNT APPEARS ADEQUATE  190 198 PLATELET CLUMPS NOTED ON SMEAR, COUNT APPEARS ADEQUATE 262  INR 1.17  --   --   --   --   APTT 38*  --   --   --   --      BMET  Recent Labs  08/31/14 1630  NA 134*  K 4.2  CL 93*  CO2 27  GLUCOSE 85  BUN 36*  CREATININE 6.36*  CALCIUM 8.0*     Liver Panel  No results found for this basename: PROT, ALBUMIN, AST, ALT, ALKPHOS, BILITOT, BILIDIR, IBILI,  in the last 72 hours     Sedimentation Rate No results found for this basename: ESRSEDRATE,  in the last 72 hours C-Reactive Protein No results found for this basename: CRP,  in the last 72 hours  Micro Results: Recent Results (from the past 720 hour(s))  CULTURE, BLOOD (ROUTINE X 2)     Status: None   Collection Time    08/27/14 10:21 PM      Result Value Ref Range Status   Specimen Description BLOOD LEFT HAND   Final   Special Requests     Final   Value: BOTTLES DRAWN AEROBIC AND ANAEROBIC 5CC BLUE,4CC RED   Culture  Setup Time     Final   Value: 08/28/2014 04:13     Performed at [facility]   Culture     Final   Value:        BLOOD CULTURE RECEIVED NO GROWTH TO DATE CULTURE WILL BE HELD FOR 5 DAYS BEFORE ISSUING A FINAL NEGATIVE REPORT     Performed at [facility]   Report Status PENDING   Incomplete  CULTURE, BLOOD (ROUTINE X 2)     Status: None   Collection Time    08/27/14 10:43 PM      Result Value Ref Range Status   Specimen Description BLOOD LEFT HAND   Final   Special Requests BOTTLES DRAWN AEROBIC ONLY 5CC   Final   Culture  Setup Time     Final   Value: 08/28/2014 04:11     Performed at [facility]   Culture     Final   Value:        BLOOD CULTURE RECEIVED NO GROWTH TO DATE CULTURE WILL BE HELD FOR 5 DAYS BEFORE ISSUING A FINAL NEGATIVE REPORT     Performed at [facility]   Report Status PENDING   Incomplete  ANAEROBIC CULTURE     Status: None   Collection Time    08/28/14 10:32 AM  Result Value Ref Range Status   Specimen Description WOUND  GRAFT   Final   Special Requests RIGHT UPPER ARM PERIGRAFT PT ON VANCO MEROPENEM   Final   Gram Stain     Final   Value: RARE WBC PRESENT,BOTH PMN AND MONONUCLEAR     NO SQUAMOUS EPITHELIAL CELLS SEEN     NO ORGANISMS SEEN     Performed at [facility]   Culture     Final   Value: NO ANAEROBES ISOLATED; CULTURE IN PROGRESS FOR 5 DAYS     Performed at [facility]   Report Status PENDING   Incomplete  WOUND CULTURE     Status: None   Collection Time    08/28/14 10:32 AM      Result Value Ref Range Status   Specimen Description WOUND GRAFT   Final   Special Requests RIGHT UPPER ARM PERIGRAFT PT ON VANCO MEROPENEM   Final   Gram Stain     Final   Value: RARE WBC PRESENT,BOTH PMN AND MONONUCLEAR     NO SQUAMOUS EPITHELIAL CELLS SEEN     NO ORGANISMS SEEN     Performed at [facility]   Culture     Final   Value: RARE ENTEROCOCCUS SPECIES     Performed at [facility]   Report Status 09/01/2014 FINAL   Final   Organism ID, Bacteria ENTEROCOCCUS SPECIES   Final  SURGICAL PCR SCREEN     Status: None   Collection Time    08/31/14  6:25 AM      Result Value Ref Range Status   MRSA, PCR NEGATIVE  NEGATIVE Final   Staphylococcus aureus NEGATIVE  NEGATIVE Final   Comment:            The Xpert SA Assay (FDA     approved for NASAL specimens     in patients over 18 years of age),     is one component of     a comprehensive surveillance     program.  Test performance has     been validated by [facility] for patients greater     than or equal to 35 year old.     It is not intended     to diagnose infection nor to     guide or monitor treatment.    Studies/Results: Dg Chest Port 1v Same Day  08/31/2014   CLINICAL DATA:  Evaluate port placement  EXAM: PORTABLE CHEST - 1 VIEW SAME DAY  COMPARISON:  None.  FINDINGS: Dual-lumen left-sided central venous catheter with the distal tip projecting over the right atrium and the more proximal tip over the  SVC.  There is no focal parenchymal opacity, pleural effusion, or pneumothorax. There is stable cardiomegaly. The osseous structures are unremarkable.  IMPRESSION: Dual-lumen left-sided central venous catheter with the distal tip projecting over the right atrium and the more proximal tip over the SVC. No pneumothorax.   Electronically Signed   By: Elige Ko   On: 08/31/2014 14:46   Dg Fluoro Guide Cv Line-no Report  08/31/2014   CLINICAL DATA:    FLOURO GUIDE CV LINE  Fluoroscopy was utilized by the requesting physician.  No radiographic  interpretation.       Assessment/Plan:  Principal Problem:   Cellulitis Active Problems:   ESRD needing dialysis   DM2 (diabetes mellitus, type 2)   HTN (hypertension)   Sepsis   Hypertension  CHF (congestive heart failure)   Peripheral vascular disease   GERD (gastroesophageal reflux disease)   Gout   Bacteremia   History of stroke    [name] is a 80 y.o. male with polymicrobial bacteremia with anteroseptal hospice and Proteus , AVG infection sp removal AVG. Now with new access placed today on ampicillin and Ceftriaxone.  #1 Polymicrobial bacteremia with AVG infection:      --My reasons for getting CT scan of abdomen and pelvis with IV and oral contrast to ensure No intra-abdominal source for current bacteremia that might cause recurrence down the road  --because he had a proven endovascular infection with AVG sp removal of AVG and Enterococcus that grew from blood and fhe graft he needs TWO drugs for 6 weeks  ONE OPTION WOULD BE  VANCOMYCIN AND GENTAMICIN WITH HD X 6 WEEKS FOR THE ENTEROCOCCUS   AND ORAL CIPRO (RENALLY DOSED) FOR THE FAIRLY R PROTEUS X 4 WEEKS  ADVANTAGE WOULD BE EASE OF USE WITH HD  DOWN SIDE WOULD BE SIGNIFICANT RISK OF OTOTOXICITY AND PERMANENT HEARING LOSS AND I AM WORRIED HE IS ALREADY HOH  WOULD WEIGH THIS REGIMEN VS PLACING DEDICATED IV AND SENDING OUT ON HIGH DOSE UNASYN AND CEFTRIAXONE WITH THE  ADAVANTAGE BEING AVOIDANCE OF OTOTOXCITY BUT DISADVANTAGE BE NEED FOR ANOTHER LINE  WOULD RECOMMEND DISCUSS WITH RENAL AS WELL   #2 Screening: HIV negative       LOS: 5 days   Acey Lav 09/01/2014, 4:42 PM

## 2014-09-01 NOTE — Procedures (Signed)
Pt seen on HD.  Ap 210 Vp 160  BFR 400.  Feels well.  Wants to go home.  I f appropriate AB combination can given as outpt then he could be DC'd from my standpoint.

## 2014-09-01 NOTE — Progress Notes (Signed)
[facility] and [facility] chart reviewed.   PROGRESS NOTE  Noah PhiStuart Juarez XBJ:[id number] DOB: 01-01-1934 DOA: 08/27/2014 PCP: No primary provider on file. Summary 80 y.o. male with past medical history of COPD, type 2 diabetes, hypertension, end-stage renal disease on hemodialysis, history of TIA, hyperlipidemia, congestive heart failure, [facility] with right upper arm pain, sweating and fever.   He was diagnosed with right arm cellulitis which is most likely secondary to fistula placement (he had AV graft placed over R upper arm by Dr. Jettie BoozeField on 9/8). He was found to have positive blood culture with enterococcus and Proteus mirabilis which are sensitive to vancomycin and meropenem. some clinical improvement [facility], but concerned AVG and or TDC infected, so [facility].  Assessment/Plan: Cellulitis in right arm, bacteremia, sepsis:  -due to infected AVG, s/p removal and removal of previous TDC -bacteremia, with enterococcus fecalis and Proteus mirabilis. Improving. TTE without vegetation. Clinically improving, never had abdominal/GI complaints. Repeat cx negative.  I think CT abd pelvis would be low yield. Had TDC replaced and HD today. Need final recs from ID, preferably something that could be given with HD, to keep patient from requiring tunneled CVL. Vanc, gent and po cipro?  Currently on ceftriaxone and unasyn.  Stable for discharge once abx regimen/length clarified.  Discussed with Dr. Ainsley SpinnerVandamm   HD in [place]. [facility] Drawn 9/29. Enterococcus Faecalis sensitive to erythromycin penicillin ampicillin vancomycin and linezolid, resistant to tetracycline. Proteus mirabilis sensitive to ciprofloxacin trimethoprim sulfa ampicillin sulbactam piperacillin/tazobactam gentamicin ertapenem meropenem, resistant to ceftriaxone, ampicillin, cefazolin, cefepime, aztreonam.  DM-II:  Decreasing insulin for morning lows  ESRD-HD:  T/T/S.   HTN:   Controlled  GERD: continue PPI   Anemia secondary to CKD/infection: stable  Deconditionining:  Home PT  Code Status: full Family Communication: patient, no family at bedside Disposition Plan: home with PT when abx regimen clarified.   Consultants:  Renal  Vascular  ID  Procedures:  Removal of AVG and TDC  New TDC  HPI/Subjective: Feeling better, stronger each day. Pain controlled. Appetite ok  Objective: Filed Vitals:   09/01/14 0952  BP: 141/65  Pulse: 56  Temp: 98.6 F (37 C)  Resp: 16    Intake/Output Summary (Last 24 hours) at 09/01/14 1408 Last data filed at 09/01/14 0952  Gross per 24 hour  Intake    230 ml  Output   4052 ml  Net  -3822 ml   Filed Weights   08/31/14 2112 09/01/14 0645 09/01/14 0952  Weight: 95.8 kg (211 lb 3.2 oz) 96.8 kg (213 lb 6.5 oz) 93.1 kg (205 lb 4 oz)    Exam:   General:  Pleasant/cooperative alert oriented  Cardiovascular: rrr without MGR  Respiratory: no wheezing, rhonchi or rales  Abdomen: +Bs, soft, NT, ND  Musculoskeletal: right arm erythema and warmth nearly resolved. Ace wrap CDI  Data Reviewed: Basic Metabolic Panel:  Recent Labs Lab 08/27/14 2243 08/28/14 0509 08/28/14 1708 08/29/14 0717 08/31/14 1630  NA 136* 134*  --  133* 134*  K 4.6 4.4  --  4.9 4.2  CL 93* 92*  --  91* 93*  CO2 29 28  --  31 27  GLUCOSE 86 71  --  77 85  BUN 33* 39*  --  58* 36*  CREATININE 4.45* 4.92*  --  6.79* 6.36*  CALCIUM 8.4 8.4  --  8.2* 8.0*  PHOS  --   --  4.1 4.5  --    Liver Function Tests:  Recent Labs Lab 08/27/14 2243 08/29/14 0717  AST 41*  --   ALT 27  --   ALKPHOS 332*  --   BILITOT 1.1  --   PROT 7.7  --   ALBUMIN 2.0* 1.9*   No results found for this basename: LIPASE, AMYLASE,  in the last 168 hours No results found for this basename: AMMONIA,  in the last 168 hours CBC:  Recent Labs Lab 08/27/14 2243 08/28/14 0509 08/29/14 0717 08/30/14 0630 08/31/14 1630    WBC 9.8 8.0 8.4 7.5 7.2  NEUTROABS 7.0  --   --   --   --   HGB 7.9* 8.9* 7.6* 7.5* 8.1*  HCT 23.3* 27.1* 23.2* 23.3* 25.3*  MCV 89.3 90.0 90.3 90.7 93.4  PLT PLATELET CLUMPS NOTED ON SMEAR, COUNT APPEARS ADEQUATE 190 198 PLATELET CLUMPS NOTED ON SMEAR, COUNT APPEARS ADEQUATE 262   Cardiac Enzymes: No results found for this basename: CKTOTAL, CKMB, CKMBINDEX, TROPONINI,  in the last 168 hours BNP (last 3 results) No results found for this basename: PROBNP,  in the last 8760 hours CBG:  Recent Labs Lab 08/31/14 1053 08/31/14 1207 08/31/14 1351 08/31/14 2150 09/01/14 1035  GLUCAP 70 89 83 120* 79    Recent Results (from the past 240 hour(s))  CULTURE, BLOOD (ROUTINE X 2)     Status: None   Collection Time    08/27/14 10:21 PM      Result Value Ref Range Status   Specimen Description BLOOD LEFT HAND   Final   Special Requests     Final   Value: BOTTLES DRAWN AEROBIC AND ANAEROBIC 5CC BLUE,4CC RED   Culture  Setup Time     Final   Value: 08/28/2014 04:13     Performed at [facility]   Culture     Final   Value:        BLOOD CULTURE RECEIVED NO GROWTH TO DATE CULTURE WILL BE HELD FOR 5 DAYS BEFORE ISSUING A FINAL NEGATIVE REPORT     Performed at [facility]   Report Status PENDING   Incomplete  CULTURE, BLOOD (ROUTINE X 2)     Status: None   Collection Time    08/27/14 10:43 PM      Result Value Ref Range Status   Specimen Description BLOOD LEFT HAND   Final   Special Requests BOTTLES DRAWN AEROBIC ONLY 5CC   Final   Culture  Setup Time     Final   Value: 08/28/2014 04:11     Performed at [facility]   Culture     Final   Value:        BLOOD CULTURE RECEIVED NO GROWTH TO DATE CULTURE WILL BE HELD FOR 5 DAYS BEFORE ISSUING A FINAL NEGATIVE REPORT     Performed at [facility]   Report Status PENDING   Incomplete  ANAEROBIC CULTURE     Status: None   Collection Time    08/28/14 10:32 AM      Result Value Ref Range Status   Specimen  Description WOUND GRAFT   Final   Special Requests RIGHT UPPER ARM PERIGRAFT PT ON VANCO MEROPENEM   Final   Gram Stain     Final   Value: RARE WBC PRESENT,BOTH PMN AND MONONUCLEAR     NO SQUAMOUS EPITHELIAL CELLS SEEN     NO ORGANISMS SEEN     Performed  at [facility]     Final   Value: NO ANAEROBES ISOLATED; CULTURE IN PROGRESS FOR 5 DAYS     Performed at [facility]   Report Status PENDING   Incomplete  WOUND CULTURE     Status: None   Collection Time    08/28/14 10:32 AM      Result Value Ref Range Status   Specimen Description WOUND GRAFT   Final   Special Requests RIGHT UPPER ARM PERIGRAFT PT ON VANCO MEROPENEM   Final   Gram Stain     Final   Value: RARE WBC PRESENT,BOTH PMN AND MONONUCLEAR     NO SQUAMOUS EPITHELIAL CELLS SEEN     NO ORGANISMS SEEN     Performed at [facility]   Culture     Final   Value: RARE ENTEROCOCCUS SPECIES     Performed at [facility]   Report Status 09/01/2014 FINAL   Final   Organism ID, Bacteria ENTEROCOCCUS SPECIES   Final  SURGICAL PCR SCREEN     Status: None   Collection Time    08/31/14  6:25 AM      Result Value Ref Range Status   MRSA, PCR NEGATIVE  NEGATIVE Final   Staphylococcus aureus NEGATIVE  NEGATIVE Final   Comment:            The Xpert SA Assay (FDA     approved for NASAL specimens     in patients over 43 years of age),     is one component of     a comprehensive surveillance     program.  Test performance has     been validated by [employer] for patients greater     than or equal to 25 year old.     It is not intended     to diagnose infection nor to     guide or monitor treatment.     Studies: Dg Chest Port 1v Same Day  08/31/2014   CLINICAL DATA:  Evaluate port placement  EXAM: PORTABLE CHEST - 1 VIEW SAME DAY  COMPARISON:  None.  FINDINGS: Dual-lumen left-sided central venous catheter with the distal tip projecting over the right atrium and the more proximal tip  over the SVC.  There is no focal parenchymal opacity, pleural effusion, or pneumothorax. There is stable cardiomegaly. The osseous structures are unremarkable.  IMPRESSION: Dual-lumen left-sided central venous catheter with the distal tip projecting over the right atrium and the more proximal tip over the SVC. No pneumothorax.   Electronically Signed   By: Elige Ko   On: 08/31/2014 14:46   Dg Fluoro Guide Cv Line-no Report  08/31/2014   CLINICAL DATA:    FLOURO GUIDE CV LINE  Fluoroscopy was utilized by the requesting physician.  No radiographic  interpretation.     Scheduled Meds: . allopurinol  100 mg Oral Daily  . amLODipine  10 mg Oral Daily  . ampicillin-sulbactam (UNASYN) IV  1.5 g Intravenous Q12H  . aspirin EC  81 mg Oral Daily  . atorvastatin  20 mg Oral q1800  . carvedilol  12.5 mg Oral BID WC  . cefTRIAXone (ROCEPHIN)  IV  2 g Intravenous Q12H  . clopidogrel  75 mg Oral Daily  . darbepoetin  100 mcg Intravenous Q Wed-HD  . doxercalciferol  2 mcg Intravenous Q M,W,F-HD  . furosemide  120 mg Oral Daily  . heparin  5,000 Units Subcutaneous 3 times per day  . insulin aspart protamine- aspart  10 Units Subcutaneous BID WC  . metoCLOPramide  5 mg Oral BID  . multivitamin  1 tablet Oral QHS  . nitroGLYCERIN  0.4 mg Transdermal Daily  . pantoprazole  40 mg Oral Daily  . sevelamer carbonate  1,600 mg Oral TID WC  . sodium bicarbonate  1,300 mg Oral BID  . sucralfate  1 g Oral TID   Continuous Infusions: . sodium chloride 10 mL/hr at 08/31/14 1205   Antibiotics Given (last 72 hours)   Date/Time Action Medication Dose Rate   08/29/14 1958 Given   meropenem (MERREM) 1 g in sodium chloride 0.9 % 100 mL IVPB 1 g 200 mL/hr   08/29/14 1958 Given  [med not avail at sched time]   vancomycin (VANCOCIN) IVPB 1000 mg/200 mL premix 1,000 mg 200 mL/hr   08/30/14 1541 Given   cefTRIAXone (ROCEPHIN) 1 g in dextrose 5 % 50 mL IVPB 1 g 100 mL/hr   08/30/14 1541 Given    ampicillin-sulbactam (UNASYN) 1.5 g in sodium chloride 0.9 % 50 mL IVPB 1.5 g 100 mL/hr   08/31/14 0459 Given   ampicillin-sulbactam (UNASYN) 1.5 g in sodium chloride 0.9 % 50 mL IVPB 1.5 g 100 mL/hr   08/31/14 2147 Given   cefTRIAXone (ROCEPHIN) 2 g in dextrose 5 % 50 mL IVPB 2 g 100 mL/hr   09/01/14 0234 Given   ampicillin-sulbactam (UNASYN) 1.5 g in sodium chloride 0.9 % 50 mL IVPB 1.5 g 100 mL/hr   09/01/14 1057 Given   cefTRIAXone (ROCEPHIN) 2 g in dextrose 5 % 50 mL IVPB 2 g 100 mL/hr     Time spent: 25  min  Serita Degroote L  [facility] Pager [phone]. If 7PM-7AM, please contact night-coverage at [email or web address], password [email or web address] 09/01/2014, 2:08 PM  LOS: 5 days

## 2014-09-02 DIAGNOSIS — L03113 Cellulitis of right upper limb: Secondary | ICD-10-CM

## 2014-09-02 DIAGNOSIS — A4150 Gram-negative sepsis, unspecified: Secondary | ICD-10-CM

## 2014-09-02 LAB — GLUCOSE, CAPILLARY
GLUCOSE-CAPILLARY: 59 mg/dL — AB (ref 70–99)
GLUCOSE-CAPILLARY: 54 mg/dL — AB (ref 70–99)
GLUCOSE-CAPILLARY: 63 mg/dL — AB (ref 70–99)
GLUCOSE-CAPILLARY: 68 mg/dL — AB (ref 70–99)
GLUCOSE-CAPILLARY: 80 mg/dL (ref 70–99)
GLUCOSE-CAPILLARY: 102 mg/dL — AB (ref 70–99)
Glucose-Capillary: 94 mg/dL (ref 70–99)
Glucose-Capillary: 93 mg/dL (ref 70–99)
Glucose-Capillary: 85 mg/dL (ref 70–99)

## 2014-09-02 LAB — ANAEROBIC CULTURE

## 2014-09-02 MED ORDER — DEXTROSE 50 % IV SOLN
INTRAVENOUS | Status: AC
  Administered 2014-09-02: 50 mL
  Filled 2014-09-02: qty 50

## 2014-09-02 MED ORDER — METOCLOPRAMIDE HCL 5 MG PO TABS
5.0000 mg | ORAL_TABLET | Freq: Three times a day (TID) | ORAL | Status: DC
  Administered 2014-09-02 – 2014-09-05 (×9): 5 mg via ORAL
  Filled 2014-09-02 (×13): qty 1

## 2014-09-02 MED ORDER — INSULIN ASPART 100 UNIT/ML ~~LOC~~ SOLN: 0.0000 [IU] | Freq: Three times a day (TID) | SUBCUTANEOUS | Status: DC

## 2014-09-02 NOTE — Progress Notes (Signed)
[facility] and [facility] chart reviewed.   PROGRESS NOTE  Noah PhiStuart Juarez UJW:[id number] DOB: [birth year] DOA: 08/27/2014 PCP: No primary provider on file. Summary 80 y.o. male with past medical history of COPD, type 2 diabetes, hypertension, end-stage renal disease on hemodialysis, history of TIA, hyperlipidemia, congestive heart failure, [facility]/29 with right upper arm pain, sweating and fever.   He was diagnosed with right arm cellulitis which is most likely secondary to fistula placement (he had AV graft placed over R upper arm by Dr. Jettie BoozeField on 9/8). He was found to have positive blood culture with enterococcus and Proteus mirabilis which are sensitive to vancomycin and meropenem. some clinical [facility], but concerned AVG and or TDC infected, so [facility].  Assessment/Plan: Cellulitis in right arm, bacteremia, sepsis:  -due to infected AVG, s/p removal and removal of previous TDC -bacteremia, with enterococcus fecalis and Proteus mirabilis. Improving. TTE without vegetation. Clinically improving, never had abdominal/GI complaints.  ID recs:VANCOMYCIN AND GENTAMICIN WITH HD X 6 WEEKS FOR THE ENTEROCOCCUS  AND ORAL CIPRO (RENALLY DOSED) FOR THE FAIRLY R PROTEUS X 4 WEEKS ID would like CT scan of abdomen and pelvis with IV and oral contrast to ensure No intra-abdominal source for current bacteremia that might cause recurrence down the road  HD in [place]- will need to verify that they have the abx to give and will monitor.  Culture and sensit[facility]- Enterococcus Faecalis sensitive to erythromycin penicillin ampicillin vancomycin and linezolid, resistant to tetracycline. Proteus mirabilis sensitive to ciprofloxacin trimethoprim sulfa ampicillin sulbactam piperacillin/tazobactam gentamicin ertapenem meropenem, resistant to ceftriaxone, ampicillin, cefazolin, cefepime, aztreonam.  DM-II:  SSI- low in AM  ESRD-HD: T/T/S.    HTN:   Controlled  GERD: continue PPI   Anemia secondary to CKD/infection: stable  Deconditionining:  Home PT  Code Status: full Family Communication: patient, no family at bedside Disposition Plan: home with PT once abx verified   Consultants:  Renal  Vascular  ID  Procedures:  Removal of AVG and TDC  New TDC  HPI/Subjective: Low BS this AM Hard of hearing  Objective: Filed Vitals:   09/02/14 0745  BP: 119/51  Pulse: 59  Temp: 97.5 F (36.4 C)  Resp: 18    Intake/Output Summary (Last 24 hours) at 09/02/14 0915 Last data filed at 09/02/14 0700  Gross per 24 hour  Intake    920 ml  Output   2000 ml  Net  -1080 ml   Filed Weights   08/31/14 2112 09/01/14 0645 09/01/14 0952  Weight: 95.8 kg (211 lb 3.2 oz) 96.8 kg (213 lb 6.5 oz) 93.1 kg (205 lb 4 oz)    Exam:   General:  Pleasant/cooperative alert oriented  Cardiovascular: rrr without MGR  Respiratory: no wheezing, rhonchi or rales  Abdomen: +Bs, soft, NT, ND  Musculoskeletal: right arm erythema and warmth nearly resolved. Ace wrap CDI  Data Reviewed: Basic Metabolic Panel:  Recent Labs Lab 08/27/14 2243 08/28/14 0509 08/28/14 1708 08/29/14 0717 08/31/14 1630  NA 136* 134*  --  133* 134*  K 4.6 4.4  --  4.9 4.2  CL 93* 92*  --  91* 93*  CO2 29 28  --  31 27  GLUCOSE 86 71  --  77 85  BUN 33* 39*  --  58* 36*  CREATININE 4.45* 4.92*  --  6.79* 6.36*  CALCIUM 8.4 8.4  --  8.2* 8.0*  PHOS  --   --  4.1 4.5  --    Liver Function Tests:  Recent Labs Lab 08/27/14 2243 08/29/14 0717  AST 41*  --   ALT 27  --   ALKPHOS 332*  --   BILITOT 1.1  --   PROT 7.7  --   ALBUMIN 2.0* 1.9*   No results found for this basename: LIPASE, AMYLASE,  in the last 168 hours No results found for this basename: AMMONIA,  in the last 168 hours CBC:  Recent Labs Lab 08/27/14 2243 08/28/14 0509 08/29/14 0717 08/30/14 0630 08/31/14 1630  WBC 9.8 8.0 8.4 7.5 7.2  NEUTROABS 7.0  --   --    --   --   HGB 7.9* 8.9* 7.6* 7.5* 8.1*  HCT 23.3* 27.1* 23.2* 23.3* 25.3*  MCV 89.3 90.0 90.3 90.7 93.4  PLT PLATELET CLUMPS NOTED ON SMEAR, COUNT APPEARS ADEQUATE 190 198 PLATELET CLUMPS NOTED ON SMEAR, COUNT APPEARS ADEQUATE 262   Cardiac Enzymes: No results found for this basename: CKTOTAL, CKMB, CKMBINDEX, TROPONINI,  in the last 168 hours BNP (last 3 results) No results found for this basename: PROBNP,  in the last 8760 hours CBG:  Recent Labs Lab 09/01/14 1732 09/01/14 2117 09/02/14 0241 09/02/14 0337 09/02/14 0741  GLUCAP 133* 73 59* 94 93    Recent Results (from the past 240 hour(s))  CULTURE, BLOOD (ROUTINE X 2)     Status: None   Collection Time    08/27/14 10:21 PM      Result Value Ref Range Status   Specimen Description BLOOD LEFT HAND   Final   Special Requests     Final   Value: BOTTLES DRAWN AEROBIC AND ANAEROBIC 5CC BLUE,4CC RED   Culture  Setup Time     Final   Value: 08/28/2014 04:13     Performed at [facility]   Culture     Final   Value:        BLOOD CULTURE RECEIVED NO GROWTH TO DATE CULTURE WILL BE HELD FOR 5 DAYS BEFORE ISSUING A FINAL NEGATIVE REPORT     Performed at [facility]   Report Status PENDING   Incomplete  CULTURE, BLOOD (ROUTINE X 2)     Status: None   Collection Time    08/27/14 10:43 PM      Result Value Ref Range Status   Specimen Description BLOOD LEFT HAND   Final   Special Requests BOTTLES DRAWN AEROBIC ONLY 5CC   Final   Culture  Setup Time     Final   Value: 08/28/2014 04:11     Performed at [facility]   Culture     Final   Value:        BLOOD CULTURE RECEIVED NO GROWTH TO DATE CULTURE WILL BE HELD FOR 5 DAYS BEFORE ISSUING A FINAL NEGATIVE REPORT     Performed at [facility]   Report Status PENDING   Incomplete  ANAEROBIC CULTURE     Status: None   Collection Time    08/28/14 10:32 AM      Result Value Ref Range Status   Specimen Description WOUND GRAFT   Final   Special Requests  RIGHT UPPER ARM PERIGRAFT PT ON VANCO MEROPENEM   Final   Gram Stain     Final   Value: RARE WBC PRESENT,BOTH PMN AND MONONUCLEAR     NO SQUAMOUS EPITHELIAL CELLS SEEN     NO ORGANISMS SEEN     Performed at  [facility]   Culture     Final   Value: NO ANAEROBES ISOLATED; CULTURE IN PROGRESS FOR 5 DAYS     Performed at [facility]   Report Status PENDING   Incomplete  WOUND CULTURE     Status: None   Collection Time    08/28/14 10:32 AM      Result Value Ref Range Status   Specimen Description WOUND GRAFT   Final   Special Requests RIGHT UPPER ARM PERIGRAFT PT ON VANCO MEROPENEM   Final   Gram Stain     Final   Value: RARE WBC PRESENT,BOTH PMN AND MONONUCLEAR     NO SQUAMOUS EPITHELIAL CELLS SEEN     NO ORGANISMS SEEN     Performed at [facility]   Culture     Final   Value: RARE ENTEROCOCCUS SPECIES     Performed at [facility]   Report Status 09/01/2014 FINAL   Final   Organism ID, Bacteria ENTEROCOCCUS SPECIES   Final  SURGICAL PCR SCREEN     Status: None   Collection Time    08/31/14  6:25 AM      Result Value Ref Range Status   MRSA, PCR NEGATIVE  NEGATIVE Final   Staphylococcus aureus NEGATIVE  NEGATIVE Final   Comment:            The Xpert SA Assay (FDA     approved for NASAL specimens     in patients over 78 years of age),     is one component of     a comprehensive surveillance     program.  Test performance has     been validated by [facility] for patients greater     than or equal to 78 year old.     It is not intended     to diagnose infection nor to     guide or monitor treatment.     Studies: Dg Chest Port 1v Same Day  08/31/2014   CLINICAL DATA:  Evaluate port placement  EXAM: PORTABLE CHEST - 1 VIEW SAME DAY  COMPARISON:  None.  FINDINGS: Dual-lumen left-sided central venous catheter with the distal tip projecting over the right atrium and the more proximal tip over the SVC.  There is no focal parenchymal opacity,  pleural effusion, or pneumothorax. There is stable cardiomegaly. The osseous structures are unremarkable.  IMPRESSION: Dual-lumen left-sided central venous catheter with the distal tip projecting over the right atrium and the more proximal tip over the SVC. No pneumothorax.   Electronically Signed   By: Elige KoHetal  Patel   On: 08/31/2014 14:46   Dg Fluoro Guide Cv Line-no Report  08/31/2014   CLINICAL DATA:    FLOURO GUIDE CV LINE  Fluoroscopy was utilized by the requesting physician.  No radiographic  interpretation.     Scheduled Meds: . allopurinol  100 mg Oral Daily  . amLODipine  10 mg Oral Daily  . ampicillin-sulbactam (UNASYN) IV  1.5 g Intravenous Q12H  . aspirin EC  81 mg Oral Daily  . atorvastatin  20 mg Oral q1800  . carvedilol  12.5 mg Oral BID WC  . cefTRIAXone (ROCEPHIN)  IV  2 g Intravenous Q12H  . clopidogrel  75 mg Oral Daily  . darbepoetin  100 mcg Intravenous Q Wed-HD  . doxercalciferol  2 mcg Intravenous Q M,W,F-HD  . furosemide  120 mg Oral Daily  . heparin  5,000  Units Subcutaneous 3 times per day  . insulin aspart protamine- aspart  8 Units Subcutaneous BID WC  . metoCLOPramide  5 mg Oral BID  . multivitamin  1 tablet Oral QHS  . nitroGLYCERIN  0.4 mg Transdermal Daily  . pantoprazole  40 mg Oral Daily  . sevelamer carbonate  1,600 mg Oral TID WC  . sodium bicarbonate  1,300 mg Oral BID  . sucralfate  1 g Oral TID   Continuous Infusions: . sodium chloride 10 mL/hr at 08/31/14 1205   Antibiotics Given (last 72 hours)   Date/Time Action Medication Dose Rate   08/30/14 1541 Given   cefTRIAXone (ROCEPHIN) 1 g in dextrose 5 % 50 mL IVPB 1 g 100 mL/hr   08/30/14 1541 Given   ampicillin-sulbactam (UNASYN) 1.5 g in sodium chloride 0.9 % 50 mL IVPB 1.5 g 100 mL/hr   08/31/14 0459 Given   ampicillin-sulbactam (UNASYN) 1.5 g in sodium chloride 0.9 % 50 mL IVPB 1.5 g 100 mL/hr   08/31/14 2147 Given   cefTRIAXone (ROCEPHIN) 2 g in dextrose 5 % 50 mL IVPB 2 g 100 mL/hr    09/01/14 0234 Given   ampicillin-sulbactam (UNASYN) 1.5 g in sodium chloride 0.9 % 50 mL IVPB 1.5 g 100 mL/hr   09/01/14 1057 Given   cefTRIAXone (ROCEPHIN) 2 g in dextrose 5 % 50 mL IVPB 2 g 100 mL/hr   09/01/14 1801 Given   ampicillin-sulbactam (UNASYN) 1.5 g in sodium chloride 0.9 % 50 mL IVPB 1.5 g 100 mL/hr   09/01/14 2103 Given   cefTRIAXone (ROCEPHIN) 2 g in dextrose 5 % 50 mL IVPB 2 g 100 mL/hr   09/02/14 0252 Given   ampicillin-sulbactam (UNASYN) 1.5 g in sodium chloride 0.9 % 50 mL IVPB 1.5 g 100 mL/hr     Time spent: 25  min  Seretha Estabrooks  [facility] Pager [phone]. If 7PM-7AM, please contact night-coverage at [email or web address], password [facility] 09/02/2014, 9:15 AM  LOS: 6 days

## 2014-09-02 NOTE — Progress Notes (Signed)
Blood sugars low this am as noted by Dr.Vann. CBG @ 1135=54,pt alert,talking,drankregular soda. CBG came up to 63 @1205 . Drank juice. CBG came up to 68,tray arrived,pt ate, CBG came up to 80. Patient nauseated x 2 after eating 100% of breakfast. CBG normal at supper. No further vomiting.

## 2014-09-02 NOTE — Progress Notes (Signed)
   VASCULAR SURGERY ASSESSMENT & PLAN:  * 5 Days Post-Op s/p: Removal of infected right upper arm AV graft with vein patch angioplasty of the high brachial artery  *  Sutures out in 3-4 days  SUBJECTIVE: Had some nausea earlier.  PHYSICAL EXAM: Filed Vitals:   09/01/14 1735 09/01/14 2118 09/02/14 0445 09/02/14 0745  BP: 137/62 121/63 123/58 119/51  Pulse: 59 56 57 59  Temp: 98.1 F (36.7 C) 97.8 F (36.6 C) 97.7 F (36.5 C) 97.5 F (36.4 C)  TempSrc: Oral Oral Oral Oral  Resp: 18 16 16 18   Height:      Weight:      SpO2: 96% 97% 99% 100%   Incisions without significant drainage.  LABS: Lab Results  Component Value Date   WBC 7.2 08/31/2014   HGB 8.1* 08/31/2014   HCT 25.3* 08/31/2014   MCV 93.4 08/31/2014   PLT 262 08/31/2014   Lab Results  Component Value Date   CREATININE 6.36* 08/31/2014   Lab Results  Component Value Date   INR 1.17 08/27/2014   CBG (last 3)   Recent Labs  09/02/14 0241 09/02/14 0337 09/02/14 0741  GLUCAP 59* 94 93    Principal Problem:   Cellulitis Active Problems:   ESRD needing dialysis   DM2 (diabetes mellitus, type 2)   HTN (hypertension)   Sepsis   Hypertension   CHF (congestive heart failure)   Peripheral vascular disease   GERD (gastroesophageal reflux disease)   Gout   Bacteremia   History of stroke   Noah [name] [name] Beeper: [phone] 09/02/2014

## 2014-09-02 NOTE — Progress Notes (Signed)
Patient is vomiting.  He feels like his blood sugar is low.  When checked at 0241, his CBG is 59.  He is currently unable to keep anything down without vomiting.  Gave 1/2 amp D50 at 0253.  When CBG rechecked at 0348, his BS is 98.  He is no longer vomiting and is resting comfortably.  Will continue to monitor.  [employer] Minor[employer]berly Jeffrie Stander RN-BC, WTA.

## 2014-09-02 NOTE — Progress Notes (Signed)
S: sitting in chair eating breakfast O:BP 119/51  Pulse 59  Temp(Src) 97.5 F (36.4 C) (Oral)  Resp 18  Ht 5\' 8"  (1.727 m)  Wt 93.1 kg (205 lb 4 oz)  BMI 31.22 kg/m2  SpO2 100%  Intake/Output Summary (Last 24 hours) at 09/02/14 0826 Last data filed at 09/02/14 0700  Gross per 24 hour  Intake    920 ml  Output   2000 ml  Net  -1080 ml   Weight change: -5.8 kg (-12 lb 12.6 oz) GNF:AOZHYGen:awake and alert CVS:RRR Resp: clear Abd: + BS NTND Ext: No edema.  RUA bandaged NEURO: CNI  No asterixis Lt permcath   . allopurinol  100 mg Oral Daily  . amLODipine  10 mg Oral Daily  . ampicillin-sulbactam (UNASYN) IV  1.5 g Intravenous Q12H  . aspirin EC  81 mg Oral Daily  . atorvastatin  20 mg Oral q1800  . carvedilol  12.5 mg Oral BID WC  . cefTRIAXone (ROCEPHIN)  IV  2 g Intravenous Q12H  . clopidogrel  75 mg Oral Daily  . darbepoetin  100 mcg Intravenous Q Wed-HD  . doxercalciferol  2 mcg Intravenous Q M,W,F-HD  . furosemide  120 mg Oral Daily  . heparin  5,000 Units Subcutaneous 3 times per day  . insulin aspart protamine- aspart  8 Units Subcutaneous BID WC  . metoCLOPramide  5 mg Oral BID  . multivitamin  1 tablet Oral QHS  . nitroGLYCERIN  0.4 mg Transdermal Daily  . pantoprazole  40 mg Oral Daily  . sevelamer carbonate  1,600 mg Oral TID WC  . sodium bicarbonate  1,300 mg Oral BID  . sucralfate  1 g Oral TID   Dg Chest Port 1v Same Day  08/31/2014   CLINICAL DATA:  Evaluate port placement  EXAM: PORTABLE CHEST - 1 VIEW SAME DAY  COMPARISON:  None.  FINDINGS: Dual-lumen left-sided central venous catheter with the distal tip projecting over the right atrium and the more proximal tip over the SVC.  There is no focal parenchymal opacity, pleural effusion, or pneumothorax. There is stable cardiomegaly. The osseous structures are unremarkable.  IMPRESSION: Dual-lumen left-sided central venous catheter with the distal tip projecting over the right atrium and the more proximal tip over  the SVC. No pneumothorax.   Electronically Signed   By: Elige KoHetal  Patel   On: 08/31/2014 14:46   Dg Fluoro Guide Cv Line-no Report  08/31/2014   CLINICAL DATA:    FLOURO GUIDE CV LINE  Fluoroscopy was utilized by the requesting physician.  No radiographic  interpretation.    BMET    Component Value Date/Time   NA 134* 08/31/2014 1630   K 4.2 08/31/2014 1630   CL 93* 08/31/2014 1630   CO2 27 08/31/2014 1630   GLUCOSE 85 08/31/2014 1630   BUN 36* 08/31/2014 1630   CREATININE 6.36* 08/31/2014 1630   CALCIUM 8.0* 08/31/2014 1630   GFRNONAA 7* 08/31/2014 1630   GFRAA 9* 08/31/2014 1630   CBC    Component Value Date/Time   WBC 7.2 08/31/2014 1630   RBC 2.71* 08/31/2014 1630   RBC 2.71* 08/31/2014 1630   HGB 8.1* 08/31/2014 1630   HCT 25.3* 08/31/2014 1630   PLT 262 08/31/2014 1630   MCV 93.4 08/31/2014 1630   MCH 29.9 08/31/2014 1630   MCHC 32.0 08/31/2014 1630   RDW 14.6 08/31/2014 1630   LYMPHSABS 1.7 08/27/2014 2243   MONOABS 0.9 08/27/2014 2243   EOSABS 0.2 08/27/2014  2243   BASOSABS 0.0 08/27/2014 2243     Assessment: 1. Enterococcus and proteus bacteremia.  Sp removal AVG and permcath 2. Anemia on aranesp 3. Sec HPTH on hectorol 4. HTN 5. DM 6. ESRD  TT[place] in [place]  Plan: 1.  Next HD tues.  I think the regimen of vanco/gent + cipro is reasonable but will have to make sure that [facility] and his nephrologist will manage it   Sanel Stemmer T

## 2014-09-03 ENCOUNTER — Encounter (HOSPITAL_COMMUNITY): Payer: Self-pay | Admitting: Vascular Surgery

## 2014-09-03 ENCOUNTER — Inpatient Hospital Stay (HOSPITAL_COMMUNITY): Payer: Medicare Other

## 2014-09-03 ENCOUNTER — Telehealth: Payer: Self-pay | Admitting: Vascular Surgery

## 2014-09-03 DIAGNOSIS — Z8673 Personal history of transient ischemic attack (TIA), and cerebral infarction without residual deficits: Secondary | ICD-10-CM

## 2014-09-03 DIAGNOSIS — M109 Gout, unspecified: Secondary | ICD-10-CM

## 2014-09-03 DIAGNOSIS — L0390 Cellulitis, unspecified: Secondary | ICD-10-CM

## 2014-09-03 DIAGNOSIS — E119 Type 2 diabetes mellitus without complications: Secondary | ICD-10-CM

## 2014-09-03 DIAGNOSIS — N185 Chronic kidney disease, stage 5: Secondary | ICD-10-CM

## 2014-09-03 DIAGNOSIS — I509 Heart failure, unspecified: Secondary | ICD-10-CM

## 2014-09-03 DIAGNOSIS — K219 Gastro-esophageal reflux disease without esophagitis: Secondary | ICD-10-CM

## 2014-09-03 DIAGNOSIS — I739 Peripheral vascular disease, unspecified: Secondary | ICD-10-CM

## 2014-09-03 LAB — GLUCOSE, CAPILLARY
Glucose-Capillary: 65 mg/dL — ABNORMAL LOW (ref 70–99)
Glucose-Capillary: 75 mg/dL (ref 70–99)
Glucose-Capillary: 65 mg/dL — ABNORMAL LOW (ref 70–99)
Glucose-Capillary: 93 mg/dL (ref 70–99)
Glucose-Capillary: 113 mg/dL — ABNORMAL HIGH (ref 70–99)

## 2014-09-03 LAB — CULTURE, BLOOD (ROUTINE X 2)
CULTURE: NO GROWTH
Culture: NO GROWTH

## 2014-09-03 MED ORDER — IOHEXOL 300 MG/ML  SOLN
100.0000 mL | Freq: Once | INTRAMUSCULAR | Status: AC | PRN
  Administered 2014-09-03: 100 mL via INTRAVENOUS

## 2014-09-03 MED ORDER — ONDANSETRON HCL 4 MG/2ML IJ SOLN
INTRAMUSCULAR | Status: AC
  Administered 2014-09-03: 4 mg
  Filled 2014-09-03: qty 2

## 2014-09-03 MED ORDER — ONDANSETRON HCL 4 MG/2ML IJ SOLN
4.0000 mg | Freq: Four times a day (QID) | INTRAMUSCULAR | Status: DC | PRN
Start: 2014-09-03 — End: 2014-09-05

## 2014-09-03 NOTE — Clinical Social Work Psychosocial (Signed)
Clinical Social Work Department BRIEF PSYCHOSOCIAL ASSESSMENT 09/03/2014  Patient:  [name],[name]     Account Number:  [id number]     Admit date:  08/27/2014  Clinical Social Worker:  Delmer IslamRAWFORD,Majorie Santee, LCSW  Date/Time:  09/03/2014 03:10 AM  Referred by:  Physician  Date Referred:  09/03/2014 Referred for  SNF Placement   Other Referral:   CSW received consult from physical therapist after working with patient today.   Interview type:  Patient Other interview type:   CSW also talked by phone with patient's son    PSYCHOSOCIAL DATA Living Status:  FAMILY Admitted from facility:   Level of care:   Primary support name:  Noah LagerRonald Juarez Primary support relationship to patient:  CHILD, ADULT Degree of support available:   Mr. [name] lives with patient and is a strong support. Patient wanted CSW to talk with son before doing a facility search.    CURRENT CONCERNS Current Concerns  Post-Acute Placement   Other Concerns:    SOCIAL WORK ASSESSMENT / PLAN CSW informed by physical therapist after working with patient that he could benefit from ST rehab. CSW talked patient right after his therapy sessigo on regarding rehab. CSW explained what type facility he would to and how Medicare covers rehab. CSW also talked with patient regarding [place], [place] dialysis treatments [place]. Patient in agreement with ST rehab and [place], but requested that CSW contact his son and talk with him before doing anything.  CSW talked by phone with Mr. Noah LagerRonald Juarez - son, [phone]) regarding ST rehab and he is in agreement, indicating that he and patient had already talked about rehab. Mr. [name] also in agreement with patient going to rehab in [place] due to [place] dialysis [place], [place].   Assessment/plan status:  Psychosocial Support/Ongoing Assessment of Needs Other assessment/ plan:   Information/referral to community resources:   None needed or  requested at this time.    PATIENT'S/FAMILY'S RESPONSE TO PLAN OF CARE: Patient pleasant and receptive to talking with CSW about ST rehab. He appears to grasp from therapist and this CSW how ST rehab will benefit him.

## 2014-09-03 NOTE — Clinical Social Work Placement (Addendum)
Clinical Social Work Department CLINICAL SOCIAL WORK PLACEMENT NOTE 09/03/2014  Patient:  [name],[name]  Account Number:  [id number] Admit date:  08/27/2014  Clinical Social Worker:  Genelle BalVANESSA Elaine Middleton, LCSW  Date/time:  09/03/2014 03:28 AM  Clinical Social Work is seeking post-discharge placement for this patient at the following level of care:   SKILLED NURSING   (*CSW will update this form in Epic as items are completed)   09/03/2014  Patient/family provided with [facility] Department of Clinical Social Work's list of facilities offering this level of care within the geographic area requested by the patient (or if unable, by the patient's family).  09/03/2014  Patient/family informed of their freedom to choose among providers that offer the needed level of care, that participate in Medicare, Medicaid or managed care program needed by the patient, have an available bed and are willing to accept the patient.    Patient/family informed of [facility]' ownership interest in [facility], as well as of the fact that they are under no obligation to receive care at this facility.  PASARR submitted to EDS on 09/03/2014 PASARR number received on 09/04/14 - [phone]  FL2 transmitted to all facilities in geographic area requested by pt/family on  09/03/2014 FL2 transmitted to all facilities within larger geographic area on   Patient informed that his/her managed care company has contracts with or will negotiate with  certain facilities, including the following:     Patient/family informed of bed offers received: 09/04/14 by phone  [facility] Physician recommends and patient chooses bed at    [facility] on Wednesday, 09/05/14   Patient to be transferred to facility by ambulance Patient and family notified of transfer on 09/05/14 Name of family member notified: Son Patsy LagerRonald Hairston ([phone])   The following physician request  were entered in Epic:  Additional Comments: 09/04/14: CSW called son Patsy LagerRonald Hairston ([phone]) and left message regarding SNF choices - [facility] and [facility] [place]. 09/04/14: Clinicals requested and  faxed into MUST (30-Day Note, FL-2 and H&P) for PASARR number. 09/04/14: 4:12 pm - PASARR number, d/c summary sent to facility. [facility] and she indicated they cannot accept patient today as cannot get his medications.   Genelle BalVanessa Tallis Soledad, LCSW [phone]

## 2014-09-03 NOTE — Progress Notes (Addendum)
ANTIBIOTIC CONSULT NOTE - INITIAL  Pharmacy Consult for discharge dose recommendations for vancomycin, gentamicin, cipro Indication: enterococcus faecalis and proteus mirabilis bacteremia  Allergies  Allergen Reactions  . Hydrocodone Other (See Comments)    Upset stomach  . Penicillins Other (See Comments)    Upset stomach    Patient Measurements: Height: 5\' 8"  (172.7 cm) Weight: 211 lb 10.3 oz (96 kg) IBW/kg (Calculated) : 68.4  Vital Signs: Temp: 98.2 F (36.8 C) (10/12 0933) Temp Source: Oral (10/12 0933) BP: 147/67 mmHg (10/12 0933) Pulse Rate: 63 (10/12 0933) Intake/Output from previous day: 10/11 0701 - 10/12 0700 In: 580 [P.O.:480; IV Piggyback:100] Out: 0  Intake/Output from this shift:    Labs:  Recent Labs  08/31/14 1630  WBC 7.2  HGB 8.1*  PLT 262  CREATININE 6.36*   Estimated Creatinine Clearance: 10.8 ml/min (by C-G formula based on Cr of 6.36). No results found for this basename: VANCOTROUGH, VANCOPEAK, VANCORANDOM, GENTTROUGH, GENTPEAK, GENTRANDOM, TOBRATROUGH, TOBRAPEAK, TOBRARND, AMIKACINPEAK, AMIKACINTROU, AMIKACIN,  in the last 72 hours   Microbiology: Recent Results (from the past 720 hour(s))  CULTURE, BLOOD (ROUTINE X 2)     Status: None   Collection Time    08/27/14 10:21 PM      Result Value Ref Range Status   Specimen Description BLOOD LEFT HAND   Final   Special Requests     Final   Value: BOTTLES DRAWN AEROBIC AND ANAEROBIC 5CC BLUE,4CC RED   Culture  Setup Time     Final   Value: 08/28/2014 04:13     Performed at [facility]   Culture     Final   Value: NO GROWTH 5 DAYS     Performed at [facility]   Report Status 09/03/2014 FINAL   Final  CULTURE, BLOOD (ROUTINE X 2)     Status: None   Collection Time    08/27/14 10:43 PM      Result Value Ref Range Status   Specimen Description BLOOD LEFT HAND   Final   Special Requests BOTTLES DRAWN AEROBIC ONLY 5CC   Final   Culture  Setup Time     Final   Value:  08/28/2014 04:11     Performed at [facility]   Culture     Final   Value: NO GROWTH 5 DAYS     Performed at [facility]   Report Status 09/03/2014 FINAL   Final  ANAEROBIC CULTURE     Status: None   Collection Time    08/28/14 10:32 AM      Result Value Ref Range Status   Specimen Description WOUND GRAFT   Final   Special Requests RIGHT UPPER ARM PERIGRAFT PT ON VANCO MEROPENEM   Final   Gram Stain     Final   Value: RARE WBC PRESENT,BOTH PMN AND MONONUCLEAR     NO SQUAMOUS EPITHELIAL CELLS SEEN     NO ORGANISMS SEEN     Performed at [facility]   Culture     Final   Value: NO ANAEROBES ISOLATED     Performed at [facility]   Report Status 09/02/2014 FINAL   Final  WOUND CULTURE     Status: None   Collection Time    08/28/14 10:32 AM      Result Value Ref Range Status   Specimen Description WOUND GRAFT   Final   Special Requests RIGHT UPPER ARM PERIGRAFT PT ON VANCO MEROPENEM  Final   Gram Stain     Final   Value: RARE WBC PRESENT,BOTH PMN AND MONONUCLEAR     NO SQUAMOUS EPITHELIAL CELLS SEEN     NO ORGANISMS SEEN     Performed at [facility]   Culture     Final   Value: RARE ENTEROCOCCUS SPECIES     Performed at [facility]   Report Status 09/01/2014 FINAL   Final   Organism ID, Bacteria ENTEROCOCCUS SPECIES   Final  SURGICAL PCR SCREEN     Status: None   Collection Time    08/31/14  6:25 AM      Result Value Ref Range Status   MRSA, PCR NEGATIVE  NEGATIVE Final   Staphylococcus aureus NEGATIVE  NEGATIVE Final   Comment:            The Xpert SA Assay (FDA     approved for NASAL specimens     in patients over 921 years of age),     is one component of     a comprehensive surveillance     program.  Test performance has     been validated by [facility] for patients greater     than or equal to 78 year old.     It is not intended     to diagnose infection nor to     guide or monitor treatment.     Medical History: Past Medical History  Diagnosis Date  . Hypertension   . CHF (congestive heart failure)   . Peripheral vascular disease   . Renal disease   . High cholesterol   . GERD (gastroesophageal reflux disease)   . Type II diabetes mellitus   . Arthritis     "all over"  . Chronic lower back pain   . Gout   . Shortness of breath   . Sleep apnea     does not  use cpap  . Stroke     family member states he's not had a stroke    Assessment: Patient is a 80 y.o M with ESRD currently on ceftriaxone and unasyn for enterococcus faecalis and proteus mirabilis bacteremia with plan to discharge on vancomycin/gent for 6 weeks and oral cipro for 4 weeks (per ID).   Goal of Therapy:  Pre-HD vancomycin level 15-25; pre-HD gentamicin level ~4  Plan:  Recommend the following abx dosing regimens for discharge: 1) gentamicin 120mg  IV x1 as loading dose, then 100 mg IV after each HD  2) vancomycin 2000mg  IV x1 as loading dose, then 1000mg  after each HD 3) ciprofloxacin 500mg  PO daily 4) weekly gentamicin and vancomycin levels 5) pharmacy will sign off.  Re-consult us if need further assistance  Hiroki Wint P 09/03/2014,12:47 PM

## 2014-09-03 NOTE — Progress Notes (Signed)
[facility]    Date of Admission:  08/27/2014   Total days of antibiotics 8        Day 5 ceftriaxone/unasyn   ID: [name] is a 80 y.o. male with enterococcal and proteus bacteremia due to AVG infection now POD#5 since removal of R AVG and vein patch angioplasty.he had insertion of tunneled hemodialysis catheter via left IJ-Diateck -27 cm on 10/9.    Principal Problem:   Cellulitis Active Problems:   ESRD needing dialysis   DM2 (diabetes mellitus, type 2)   HTN (hypertension)   Sepsis   Hypertension   CHF (congestive heart failure)   Peripheral vascular disease   GERD (gastroesophageal reflux disease)   Gout   Bacteremia   History of stroke    Subjective: afebrile  Medications:  . allopurinol  100 mg Oral Daily  . amLODipine  10 mg Oral Daily  . ampicillin-sulbactam (UNASYN) IV  1.5 g Intravenous Q12H  . aspirin EC  81 mg Oral Daily  . atorvastatin  20 mg Oral q1800  . carvedilol  12.5 mg Oral BID WC  . cefTRIAXone (ROCEPHIN)  IV  2 g Intravenous Q12H  . clopidogrel  75 mg Oral Daily  . darbepoetin  100 mcg Intravenous Q Wed-HD  . doxercalciferol  2 mcg Intravenous Q M,W,F-HD  . furosemide  120 mg Oral Daily  . heparin  5,000 Units Subcutaneous 3 times per day  . insulin aspart  0-9 Units Subcutaneous TID WC  . insulin aspart protamine- aspart  8 Units Subcutaneous BID WC  . metoCLOPramide  5 mg Oral TID AC & HS  . multivitamin  1 tablet Oral QHS  . nitroGLYCERIN  0.4 mg Transdermal Daily  . pantoprazole  40 mg Oral Daily  . sevelamer carbonate  1,600 mg Oral TID WC  . sodium bicarbonate  1,300 mg Oral BID  . sucralfate  1 g Oral TID    Objective: Vital signs in last 24 hours: Temp:  [97.7 F (36.5 C)-98.6 F (37 C)] 98.2 F (36.8 C) (10/12 0933) Pulse Rate:  [56-64] 63 (10/12 0933) Resp:  [16-19] 16 (10/12 0933) BP: (130-147)/(61-73) 147/67 mmHg (10/12 0933) SpO2:  [99 %] 99 % (10/12 0933) Weight:  [211 lb 10.3 oz (96  kg)] 211 lb 10.3 oz (96 kg) (10/11 2109) Physical Exam  Constitutional: He is oriented to person, place, and time. He appears well-developed and well-nourished. No distress.  HENT:  Mouth/Throat: Oropharynx is clear and moist. No oropharyngeal exudate.  Cardiovascular: Normal rate, regular rhythm and normal heart sounds. Exam reveals no gallop and no friction rub.  No murmur heard.  Pulmonary/Chest: Effort normal and breath sounds normal. No respiratory distress. He has no wheezes.  Abdominal: Soft. Bowel sounds are normal. He exhibits no distension. There is no tenderness.  Lymphadenopathy:  He has no cervical adenopathy.  Neurological: He is alert and oriented to person, place, and time.  Ext: right upper arm wrapped Psychiatric: He has a normal mood and affect. His behavior is normal.    Lab Results  Recent Labs  08/31/14 1630  WBC 7.2  HGB 8.1*  HCT 25.3*  NA 134*  K 4.2  CL 93*  CO2 27  BUN 36*  CREATININE 6.36*   Liver Panel No results found for this basename: PROT, ALBUMIN, AST, ALT, ALKPHOS, BILITOT, BILIDIR, IBILI,  in the last 72 hours Sedimentation Rate No results found for this basename: ESRSEDRATE,  in the last 72  hours C-Reactive Protein No results found for this basename: CRP,  in the last 72 hours  Microbiology:  Studies/Results: Ct Abdomen Pelvis W Contrast  09/03/2014   CLINICAL DATA:  G negative bacteremia. Right arm cellulitis and sepsis. End-stage renal disease on hemodialysis with recent right upper extremity AV graft placement.  EXAM: CT ABDOMEN AND PELVIS WITH CONTRAST  TECHNIQUE: Multidetector CT imaging of the abdomen and pelvis was performed using the standard protocol following bolus administration of intravenous contrast.  CONTRAST:  100mL OMNIPAQUE IOHEXOL 300 MG/ML  SOLN  COMPARISON:  08/18/2014  FINDINGS: There are 3 small nodules in the right middle lobe measuring up to 3 mm in size, not clearly present on multiple prior examinations. There  is also a 4 mm nodule along the right major fissure. Subsegmental atelectasis is present in the lung bases, and there are small right and trace left pleural effusions. Right pleural effusion appears slightly smaller than on the prior study. LAD and right coronary artery calcifications are noted. Central venous catheter terminates in the high right atrium.  Punctate calcification in the posterior right hepatic lobe is unchanged. Gallbladder is surgically absent. No biliary dilatation is seen. Multiple subcentimeter splenules are noted. The adrenal glands and pancreas have an unremarkable enhanced appearance. 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  Oral contrast is seen in multiple loops of nondilated small and large bowel to the level of the rectum without evidence of obstruction. Scattered colonic diverticulosis is noted without evidence of diverticulitis. Appendix is unremarkable. Bladder appears thick-walled, however evaluation is limited by underdistention.  Focal subcutaneous fat stranding in the right lower anterior abdominal wall is unchanged. Small foci of air in the lower anterior abdominal wall may reflect recent subcutaneous injections. Small calcified granulomas are noted in the right gluteal region. Small gastrohepatic lymph nodes measure up to 1.3 cm in short axis, not significantly changed and likely reactive. Mild aortoiliac calcification is noted. No free fluid is seen. Thoracolumbar spondylosis is noted.  IMPRESSION: 1. Circumferential bladder wall thickening, query cystitis. 2. Small right pleural effusion, slightly decreased from prior. 3. Several sub 5 mm nodules in the right lung base, likely infectious/inflammatory. If the patient is at high risk for bronchogenic carcinoma, follow-up chest CT at 1 year is recommended. If the patient is at low risk, no follow-up is needed. This recommendation follows the consensus statement: Guidelines for Management of Small Pulmonary Nodules Detected on CT  Scans: A Statement from the Fleischner Society as published in Radiology 2005; 237:395-400.   Electronically Signed   By: Sebastian AcheAllen  Grady   On: 09/03/2014 09:27     Assessment/Plan: Polymicrobial bacteremia due to AVG infection now removed with amp S enterococcus and sensitive proteus.  - due to limited vascular access, nephrology asked for us to devise antibiotic regimen to be given with hemodialysis. - recommendations were made to start vancomycin and gentamicin post HD x 6 wks. - recommend that Mr. Randel PiggKeen gets baseline hearing test to see if he has any ototoxicity associated with gentamicin, that may worsen his hearing. - if he has worsening hearing or imbalance/vestibular issues, would d/c vanco and gent and switch to daptomycin dosed at 8mg /kg QOD, which would require a line - would use Oct 8th as day 1 of 42 days schedule - recommend he takes cipro 500mg  po daily x 4 wk for proteus bacteremia  Kenny Rea, [facility] Cell: [phone] Pager: [phone]  09/03/2014, 3:59 PM

## 2014-09-03 NOTE — Progress Notes (Signed)
Physical Therapy Treatment Patient Details Name: [name] MRN: [id number] DOB: 11/06/1934 Today's Date: 09/03/2014    History of Present Illness HPI: [name] is a 80 y.o. male with past medical history of COPD, type 2 diabetes, hypertension, end-stage renal disease on hemodialysis, history of TIA, hyperlipidemia, congestive heart failure, who presents with right upper arm pain, sweating and fever. Perm HD cath out of right upper arm on 10/7 with plans for new one on 10/9    PT Comments    Continuing to work with [name] for activity tolerance, gait stability with the goal of maximizing independence and safety with mobility prior to dc home; At this point, I believe it is worth considering SNF for post-acute Rehab; Discussed with pt and SW; Pt is agreeable   Follow Up Recommendations  SNF;Supervision/Assistance - 24 hour     Equipment Recommendations  Rolling walker with 5" wheels;3in1 (PT)    Recommendations for Other Services       Precautions / Restrictions Precautions Precautions: Fall Precaution Comments: h/o  R UE fracture with falls    Mobility  Bed Mobility               General bed mobility comments: recieved in chair  Transfers Overall transfer level: Needs assistance Equipment used: Rolling walker (2 wheeled) Transfers: Sit to/from Stand Sit to Stand: Min guard (with and without phsyical contact)         General transfer comment: cues for out at edge and increased time to scoot forward, heavy UE use to power up  Ambulation/Gait Ambulation/Gait assistance: Min guard Ambulation Distance (Feet): 80 Feet Assistive device: Rolling walker (2 wheeled)       General Gait Details: Cues for upright posture, and to self-monitor for activity tolerance; ultimately pt required chair to be brought to him due to fatigue with amb   Stairs            Wheelchair Mobility    Modified Rankin (Stroke Patients Only)       Balance              Standing balance-Leahy Scale: Poor                      Cognition Arousal/Alertness: Awake/alert Behavior During Therapy: WFL for tasks assessed/performed Overall Cognitive Status: Within Functional Limits for tasks assessed                      Exercises      General Comments        Pertinent Vitals/Pain Pain Assessment: No/denies pain    Home Living                      Prior Function            PT Goals (current goals can now be found in the care plan section) Acute Rehab PT Goals Patient Stated Goal: to go home once he is ready Time For Goal Achievement: 09/12/14 Potential to Achieve Goals: Good Progress towards PT goals: Progressing toward goals    Frequency  Min 3X/week    PT Plan Discharge plan needs to be updated    Co-evaluation             End of Session Equipment Utilized During Treatment: Gait belt Activity Tolerance: Patient limited by fatigue Patient left: in chair;with chair alarm set;with call bell/phone within reach     Time: 1355-1419 PT Time Calculation (min):  24 min  Charges:  $Gait Training: 23-37 mins                    G Codes:      Olen PelGarrigan, Flynn Gwyn Hamff 09/03/2014, 3:44 PM  Van ClinesHolly Riven [place], [place]  Acute Rehabilitation Services Pager [phone] Office [phone]

## 2014-09-03 NOTE — Progress Notes (Signed)
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

## 2014-09-03 NOTE — Progress Notes (Signed)
PROGRESS NOTE  [name] ZOX:[id number] DOB: 1934/11/22 DOA: 08/27/2014 PCP: No primary provider on file. Summary 80 y.o. male with past medical history of COPD, type 2 diabetes, hypertension, end-stage renal disease on hemodialysis, history of TIA, hyperlipidemia, congestive heart failure, presented to [facility] on 9/29 with right upper arm pain, sweating and fever.   He was diagnosed with right arm cellulitis which is most likely secondary to fistula placement (he had AV graft placed over R upper arm by Dr. Jettie Booze on 9/8). He was found to have positive blood culture with enterococcus and Proteus mirabilis which are sensitive to vancomycin and meropenem. some clinical improvement in [facility] hospital, but concerned AVG and or TDC infected, so transferred to [facility] for vascular eval.  Assessment/Plan: Cellulitis in right arm, bacteremia, sepsis:  -due to infected AVG, s/p removal and removal of previous TDC -bacteremia, with enterococcus fecalis and Proteus mirabilis. Improving. TTE without vegetation. Clinically improving, never had abdominal/GI complaints.  ID recs:VANCOMYCIN AND GENTAMICIN WITH HD X 6 WEEKS FOR THE ENTEROCOCCUS  AND ORAL CIPRO (RENALLY DOSED) FOR THE FAIRLY R PROTEUS X 4 WEEKS ID would like CT scan of abdomen and pelvis with IV and oral contrast to ensure No intra-abdominal source for current bacteremia that might cause recurrence down the road- scan remarkable for ?cystitis- patient does not make urine  HD in [place]- verifing that [facility] will have ability to give the abx - Dr.  Kristian Covey to monitor Culture and sensitivities received from [facility]- Enterococcus Faecalis sensitive to erythromycin penicillin ampicillin vancomycin and linezolid, resistant to tetracycline. Proteus mirabilis sensitive to ciprofloxacin trimethoprim sulfa ampicillin sulbactam piperacillin/tazobactam gentamicin ertapenem meropenem, resistant to ceftriaxone, ampicillin, cefazolin, cefepime,  aztreonam.  DM-II:  SSI- low in AM  ESRD-HD: T/T/S.   HTN:   Controlled  GERD: continue PPI   Anemia secondary to CKD/infection: stable  Deconditionining:  Home PT  Code Status: full Family Communication: patient, no family at bedside Disposition Plan: home after dialysis tomm?   Consultants:  Renal  Vascular  ID  Procedures:  Removal of AVG and TDC  New TDC  HPI/Subjective: Asking for food now that he is back from CT scan  Objective: Filed Vitals:   09/03/14 0933  BP: 147/67  Pulse: 63  Temp: 98.2 F (36.8 C)  Resp: 16    Intake/Output Summary (Last 24 hours) at 09/03/14 1240 Last data filed at 09/03/14 0454  Gross per 24 hour  Intake    530 ml  Output      0 ml  Net    530 ml   Filed Weights   09/01/14 0645 09/01/14 0952 09/02/14 2109  Weight: 96.8 kg (213 lb 6.5 oz) 93.1 kg (205 lb 4 oz) 96 kg (211 lb 10.3 oz)    Exam:   General:  Pleasant/cooperative alert oriented  Cardiovascular: rrr without MGR  Respiratory: no wheezing, rhonchi or rales  Abdomen: +Bs, soft, NT, ND  Musculoskeletal: right arm erythema and warmth nearly resolved. Ace wrap CDI  Data Reviewed: Basic Metabolic Panel:  Recent Labs Lab 08/27/14 2243 08/28/14 0509 08/28/14 1708 08/29/14 0717 08/31/14 1630  NA 136* 134*  --  133* 134*  K 4.6 4.4  --  4.9 4.2  CL 93* 92*  --  91* 93*  CO2 29 28  --  31 27  GLUCOSE 86 71  --  77 85  BUN 33* 39*  --  58* 36*  CREATININE 4.45* 4.92*  --  6.79* 6.36*  CALCIUM 8.4  8.4  --  8.2* 8.0*  PHOS  --   --  4.1 4.5  --    Liver Function Tests:  Recent Labs Lab 08/27/14 2243 08/29/14 0717  AST 41*  --   ALT 27  --   ALKPHOS 332*  --   BILITOT 1.1  --   PROT 7.7  --   ALBUMIN 2.0* 1.9*   No results found for this basename: LIPASE, AMYLASE,  in the last 168 hours No results found for this basename: AMMONIA,  in the last 168 hours CBC:  Recent Labs Lab 08/27/14 2243 08/28/14 0509 08/29/14 0717  08/30/14 0630 08/31/14 1630  WBC 9.8 8.0 8.4 7.5 7.2  NEUTROABS 7.0  --   --   --   --   HGB 7.9* 8.9* 7.6* 7.5* 8.1*  HCT 23.3* 27.1* 23.2* 23.3* 25.3*  MCV 89.3 90.0 90.3 90.7 93.4  PLT PLATELET CLUMPS NOTED ON SMEAR, COUNT APPEARS ADEQUATE 190 198 PLATELET CLUMPS NOTED ON SMEAR, COUNT APPEARS ADEQUATE 262   Cardiac Enzymes: No results found for this basename: CKTOTAL, CKMB, CKMBINDEX, TROPONINI,  in the last 168 hours BNP (last 3 results) No results found for this basename: PROBNP,  in the last 8760 hours CBG:  Recent Labs Lab 09/02/14 1704 09/02/14 2103 09/03/14 0433 09/03/14 0738 09/03/14 1202  GLUCAP 102* 85 65* 75 65*    Recent Results (from the past 240 hour(s))  CULTURE, BLOOD (ROUTINE X 2)     Status: None   Collection Time    08/27/14 10:21 PM      Result Value Ref Range Status   Specimen Description BLOOD LEFT HAND   Final   Special Requests     Final   Value: BOTTLES DRAWN AEROBIC AND ANAEROBIC 5CC BLUE,4CC RED   Culture  Setup Time     Final   Value: 08/28/2014 04:13     Performed at [facility]   Culture     Final   Value: NO GROWTH 5 DAYS     Performed at [facility]   Report Status 09/03/2014 FINAL   Final  CULTURE, BLOOD (ROUTINE X 2)     Status: None   Collection Time    08/27/14 10:43 PM      Result Value Ref Range Status   Specimen Description BLOOD LEFT HAND   Final   Special Requests BOTTLES DRAWN AEROBIC ONLY 5CC   Final   Culture  Setup Time     Final   Value: 08/28/2014 04:11     Performed at [facility]   Culture     Final   Value: NO GROWTH 5 DAYS     Performed at [facility]   Report Status 09/03/2014 FINAL   Final  ANAEROBIC CULTURE     Status: None   Collection Time    08/28/14 10:32 AM      Result Value Ref Range Status   Specimen Description WOUND GRAFT   Final   Special Requests RIGHT UPPER ARM PERIGRAFT PT ON VANCO MEROPENEM   Final   Gram Stain     Final   Value: RARE WBC PRESENT,BOTH  PMN AND MONONUCLEAR     NO SQUAMOUS EPITHELIAL CELLS SEEN     NO ORGANISMS SEEN     Performed at [facility]   Culture     Final   Value: NO ANAEROBES ISOLATED     Performed at [facility]   Report Status  09/02/2014 FINAL   Final  WOUND CULTURE     Status: None   Collection Time    08/28/14 10:32 AM      Result Value Ref Range Status   Specimen Description WOUND GRAFT   Final   Special Requests RIGHT UPPER ARM PERIGRAFT PT ON VANCO MEROPENEM   Final   Gram Stain     Final   Value: RARE WBC PRESENT,BOTH PMN AND MONONUCLEAR     NO SQUAMOUS EPITHELIAL CELLS SEEN     NO ORGANISMS SEEN     Performed at [facility]   Culture     Final   Value: RARE ENTEROCOCCUS SPECIES     Performed at [facility]   Report Status 09/01/2014 FINAL   Final   Organism ID, Bacteria ENTEROCOCCUS SPECIES   Final  SURGICAL PCR SCREEN     Status: None   Collection Time    08/31/14  6:25 AM      Result Value Ref Range Status   MRSA, PCR NEGATIVE  NEGATIVE Final   Staphylococcus aureus NEGATIVE  NEGATIVE Final   Comment:            The Xpert SA Assay (FDA     approved for NASAL specimens     in patients over 78 years of age),     is one component of     a comprehensive surveillance     program.  Test performance has     been validated by [facility] for patients greater     than or equal to 78 year old.     It is not intended     to diagnose infection nor to     guide or monitor treatment.     Studies: Ct Abdomen Pelvis W Contrast  09/03/2014   CLINICAL DATA:  G negative bacteremia. Right arm cellulitis and sepsis. End-stage renal disease on hemodialysis with recent right upper extremity AV graft placement.  EXAM: CT ABDOMEN AND PELVIS WITH CONTRAST  TECHNIQUE: Multidetector CT imaging of the abdomen and pelvis was performed using the standard protocol following bolus administration of intravenous contrast.  CONTRAST:  100mL OMNIPAQUE IOHEXOL 300 MG/ML  SOLN   COMPARISON:  08/18/2014  FINDINGS: There are 3 small nodules in the right middle lobe measuring up to 3 mm in size, not clearly present on multiple prior examinations. There is also a 4 mm nodule along the right major fissure. Subsegmental atelectasis is present in the lung bases, and there are small right and trace left pleural effusions. Right pleural effusion appears slightly smaller than on the prior study. LAD and right coronary artery calcifications are noted. Central venous catheter terminates in the high right atrium.  Punctate calcification in the posterior right hepatic lobe is unchanged. Gallbladder is surgically absent. No biliary dilatation is seen. Multiple subcentimeter splenules are noted. The adrenal glands and pancreas have an unremarkable enhanced appearance. 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  Oral contrast is seen in multiple loops of nondilated small and large bowel to the level of the rectum without evidence of obstruction. Scattered colonic diverticulosis is noted without evidence of diverticulitis. Appendix is unremarkable. Bladder appears thick-walled, however evaluation is limited by underdistention.  Focal subcutaneous fat stranding in the right lower anterior abdominal wall is unchanged. Small foci of air in the lower anterior abdominal wall may reflect recent subcutaneous injections. Small calcified granulomas are noted in the right gluteal region. Small gastrohepatic lymph nodes  measure up to 1.3 cm in short axis, not significantly changed and likely reactive. Mild aortoiliac calcification is noted. No free fluid is seen. Thoracolumbar spondylosis is noted.  IMPRESSION: 1. Circumferential bladder wall thickening, query cystitis. 2. Small right pleural effusion, slightly decreased from prior. 3. Several sub 5 mm nodules in the right lung base, likely infectious/inflammatory. If the patient is at high risk for bronchogenic carcinoma, follow-up chest CT at 1 year is recommended. If  the patient is at low risk, no follow-up is needed. This recommendation follows the consensus statement: Guidelines for Management of Small Pulmonary Nodules Detected on CT Scans: A Statement from the Fleischner Society as published in Radiology 2005; 237:395-400.   Electronically Signed   By: Sebastian AcheAllen  Grady   On: 09/03/2014 09:27    Scheduled Meds: . allopurinol  100 mg Oral Daily  . amLODipine  10 mg Oral Daily  . ampicillin-sulbactam (UNASYN) IV  1.5 g Intravenous Q12H  . aspirin EC  81 mg Oral Daily  . atorvastatin  20 mg Oral q1800  . carvedilol  12.5 mg Oral BID WC  . cefTRIAXone (ROCEPHIN)  IV  2 g Intravenous Q12H  . clopidogrel  75 mg Oral Daily  . darbepoetin  100 mcg Intravenous Q Wed-HD  . doxercalciferol  2 mcg Intravenous Q M,W,F-HD  . furosemide  120 mg Oral Daily  . heparin  5,000 Units Subcutaneous 3 times per day  . insulin aspart  0-9 Units Subcutaneous TID WC  . insulin aspart protamine- aspart  8 Units Subcutaneous BID WC  . metoCLOPramide  5 mg Oral TID AC & HS  . multivitamin  1 tablet Oral QHS  . nitroGLYCERIN  0.4 mg Transdermal Daily  . pantoprazole  40 mg Oral Daily  . sevelamer carbonate  1,600 mg Oral TID WC  . sodium bicarbonate  1,300 mg Oral BID  . sucralfate  1 g Oral TID   Continuous Infusions: . sodium chloride 10 mL/hr (09/02/14 2158)   Antibiotics Given (last 72 hours)   Date/Time Action Medication Dose Rate   08/31/14 2147 Given   cefTRIAXone (ROCEPHIN) 2 g in dextrose 5 % 50 mL IVPB 2 g 100 mL/hr   09/01/14 0234 Given   ampicillin-sulbactam (UNASYN) 1.5 g in sodium chloride 0.9 % 50 mL IVPB 1.5 g 100 mL/hr   09/01/14 1057 Given   cefTRIAXone (ROCEPHIN) 2 g in dextrose 5 % 50 mL IVPB 2 g 100 mL/hr   09/01/14 1801 Given   ampicillin-sulbactam (UNASYN) 1.5 g in sodium chloride 0.9 % 50 mL IVPB 1.5 g 100 mL/hr   09/01/14 2103 Given   cefTRIAXone (ROCEPHIN) 2 g in dextrose 5 % 50 mL IVPB 2 g 100 mL/hr   09/02/14 0252 Given    ampicillin-sulbactam (UNASYN) 1.5 g in sodium chloride 0.9 % 50 mL IVPB 1.5 g 100 mL/hr   09/02/14 0956 Given   cefTRIAXone (ROCEPHIN) 2 g in dextrose 5 % 50 mL IVPB 2 g 100 mL/hr   09/02/14 1832 Given   ampicillin-sulbactam (UNASYN) 1.5 g in sodium chloride 0.9 % 50 mL IVPB 1.5 g 100 mL/hr   09/02/14 2152 Given   cefTRIAXone (ROCEPHIN) 2 g in dextrose 5 % 50 mL IVPB 2 g 100 mL/hr   09/03/14 0234 Given   ampicillin-sulbactam (UNASYN) 1.5 g in sodium chloride 0.9 % 50 mL IVPB 1.5 g 100 mL/hr     Time spent: 25  min  Khyan Oats  [facility] Pager [phone]. If 7PM-7AM, please  contact night-coverage at [email or web address], password [facility] 09/03/2014, 12:40 PM  LOS: 7 days

## 2014-09-03 NOTE — Telephone Encounter (Addendum)
Message copied by Fredrich BirksMILLIKAN, DANA P on Mon Sep 03, 2014  1:25 PM ------      Message from: Phillips OdorPULLINS, CAROL S      Created: Fri Aug 31, 2014  4:02 PM      Regarding: Tiburcio BashKay log; also needs f/u appt. with [facility] w/ Suture remov in 2 weeks                   ----- Message -----         From: Dara LordsSamantha J Rhyne, PA-C         Sent: 08/31/2014   1:56 PM           To: [facility] Charge Pool            S/p:      1. Removal of infected right upper arm AV graft      2. Vein patch angioplasty of the right brachial artery      08/28/14.      F/u with Dr. Edilia Boickson in 2 weeks (he will need suture removal).            Thanks,      SAmantha ------  09/03/14: left msg for patient re appt, dpm

## 2014-09-03 NOTE — Progress Notes (Signed)
Patient ID: [name], male   DOB: Sep 29, 1934, 80 y.o.   MRN: [id number]  [facility] Progress Note    Subjective:   Pt without new complaints   Objective:   BP 147/67  Pulse 63  Temp(Src) 98.2 F (36.8 C) (Oral)  Resp 16  Ht 5\' 8"  (1.727 m)  Wt 96 kg (211 lb 10.3 oz)  BMI 32.19 kg/m2  SpO2 99%  Intake/Output: I/O last 3 completed shifts: In: 1400 [P.O.:1200; IV Piggyback:200] Out: 0    Intake/Output this shift:    Weight change: 2.9 kg (6 lb 6.3 oz)  Physical Exam: Gen:WD elderly man in NAD CVS:no rub Resp:cta XBM:WUXLKGAbd:benign Ext:+ edema RUExt  Labs: BMET  Recent Labs Lab 08/27/14 2243 08/28/14 0509 08/28/14 1708 08/29/14 0717 08/31/14 1630  NA 136* 134*  --  133* 134*  K 4.6 4.4  --  4.9 4.2  CL 93* 92*  --  91* 93*  CO2 29 28  --  31 27  GLUCOSE 86 71  --  77 85  BUN 33* 39*  --  58* 36*  CREATININE 4.45* 4.92*  --  6.79* 6.36*  ALBUMIN 2.0*  --   --  1.9*  --   CALCIUM 8.4 8.4  --  8.2* 8.0*  PHOS  --   --  4.1 4.5  --    CBC  Recent Labs Lab 08/27/14 2243 08/28/14 0509 08/29/14 0717 08/30/14 0630 08/31/14 1630  WBC 9.8 8.0 8.4 7.5 7.2  NEUTROABS 7.0  --   --   --   --   HGB 7.9* 8.9* 7.6* 7.5* 8.1*  HCT 23.3* 27.1* 23.2* 23.3* 25.3*  MCV 89.3 90.0 90.3 90.7 93.4  PLT PLATELET CLUMPS NOTED ON SMEAR, COUNT APPEARS ADEQUATE 190 198 PLATELET CLUMPS NOTED ON SMEAR, COUNT APPEARS ADEQUATE 262    @IMGRELPRIORS @ Medications:    . allopurinol  100 mg Oral Daily  . amLODipine  10 mg Oral Daily  . ampicillin-sulbactam (UNASYN) IV  1.5 g Intravenous Q12H  . aspirin EC  81 mg Oral Daily  . atorvastatin  20 mg Oral q1800  . carvedilol  12.5 mg Oral BID WC  . cefTRIAXone (ROCEPHIN)  IV  2 g Intravenous Q12H  . clopidogrel  75 mg Oral Daily  . darbepoetin  100 mcg Intravenous Q Wed-HD  . doxercalciferol  2 mcg Intravenous Q M,W,F-HD  . furosemide  120 mg Oral Daily  . heparin  5,000 Units Subcutaneous 3 times per day  . insulin  aspart  0-9 Units Subcutaneous TID WC  . insulin aspart protamine- aspart  8 Units Subcutaneous BID WC  . metoCLOPramide  5 mg Oral TID AC & HS  . multivitamin  1 tablet Oral QHS  . nitroGLYCERIN  0.4 mg Transdermal Daily  . pantoprazole  40 mg Oral Daily  . sevelamer carbonate  1,600 mg Oral TID WC  . sodium bicarbonate  1,300 mg Oral BID  . sucralfate  1 g Oral TID     Assessment/ Plan:   1. Enterococcus and proteus bacteremia. Sp removal of infected AVG and permcath now with new TDC. 1. Will need to make sure [facility]/Dr. Kristian CoveyBefekadu are able to manage vanc/gent as an outpt prior to discharge.   2. Anemia on aranesp  3. Sec HPTH on hectorol / renvela 4. HTN - stable 5. DM - per primary 6. [place] T[place] 7. Dispo- once [facility] can verify abx management as an outpt  [name] 09/03/2014, 10:33 AM

## 2014-09-04 LAB — GLUCOSE, CAPILLARY
GLUCOSE-CAPILLARY: 119 mg/dL — AB (ref 70–99)
GLUCOSE-CAPILLARY: 119 mg/dL — AB (ref 70–99)
Glucose-Capillary: 64 mg/dL — ABNORMAL LOW (ref 70–99)
Glucose-Capillary: 114 mg/dL — ABNORMAL HIGH (ref 70–99)

## 2014-09-04 LAB — RENAL FUNCTION PANEL
ALBUMIN: 2 g/dL — AB (ref 3.5–5.2)
ANION GAP: 14 (ref 5–15)
BUN: 24 mg/dL — AB (ref 6–23)
CHLORIDE: 93 meq/L — AB (ref 96–112)
CO2: 27 meq/L (ref 19–32)
Calcium: 8.5 mg/dL (ref 8.4–10.5)
Creatinine, Ser: 6.92 mg/dL — ABNORMAL HIGH (ref 0.50–1.35)
GFR calc Af Amer: 8 mL/min — ABNORMAL LOW (ref >90)
GFR, EST NON AFRICAN AMERICAN: 7 mL/min — AB (ref >90)
Glucose, Bld: 107 mg/dL — ABNORMAL HIGH (ref 70–99)
POTASSIUM: 3.8 meq/L (ref 3.7–5.3)
Phosphorus: 5 mg/dL — ABNORMAL HIGH (ref 2.3–4.6)
Sodium: 134 mEq/L — ABNORMAL LOW (ref 137–147)

## 2014-09-04 LAB — CBC
HEMATOCRIT: 23.2 % — AB (ref 39.0–52.0)
Hemoglobin: 7.5 g/dL — ABNORMAL LOW (ref 13.0–17.0)
MCH: 29.2 pg (ref 26.0–34.0)
MCHC: 32.3 g/dL (ref 30.0–36.0)
MCV: 90.3 fL (ref 78.0–100.0)
Platelets: 292 10*3/uL (ref 150–400)
RBC: 2.57 MIL/uL — AB (ref 4.22–5.81)
RDW: 14.5 % (ref 11.5–15.5)
WBC: 6.2 10*3/uL (ref 4.0–10.5)

## 2014-09-04 MED ORDER — GENTAMICIN IN SALINE 1-0.9 MG/ML-% IV SOLN: 100.0000 mg | INTRAVENOUS | Status: DC

## 2014-09-04 MED ORDER — GENTAMICIN IN SALINE 1-0.9 MG/ML-% IV SOLN: 100.0000 mg | INTRAVENOUS | Status: AC

## 2014-09-04 MED ORDER — VANCOMYCIN HCL IN DEXTROSE 1-5 GM/200ML-% IV SOLN: 1000.0000 mg | INTRAVENOUS | Status: AC

## 2014-09-04 MED ORDER — SEVELAMER CARBONATE 800 MG PO TABS: 1600.0000 mg | ORAL_TABLET | Freq: Three times a day (TID) | ORAL | Status: AC

## 2014-09-04 MED ORDER — VANCOMYCIN HCL IN DEXTROSE 1-5 GM/200ML-% IV SOLN: 1000.0000 mg | INTRAVENOUS | Status: DC

## 2014-09-04 MED ORDER — DARBEPOETIN ALFA-POLYSORBATE 100 MCG/0.5ML IJ SOLN: 100.0000 ug | INTRAMUSCULAR | Status: AC

## 2014-09-04 MED ORDER — RENA-VITE PO TABS: 1.0000 | ORAL_TABLET | Freq: Every day | ORAL | Status: AC

## 2014-09-04 MED ORDER — OXYCODONE-ACETAMINOPHEN 5-325 MG PO TABS: 1.0000 | ORAL_TABLET | ORAL | Status: AC | PRN

## 2014-09-04 MED ORDER — INSULIN ASPART PROT & ASPART (70-30 MIX) 100 UNIT/ML ~~LOC~~ SUSP: 8.0000 [IU] | Freq: Two times a day (BID) | SUBCUTANEOUS | Status: AC

## 2014-09-04 MED ORDER — CIPROFLOXACIN HCL 500 MG PO TABS
500.0000 mg | ORAL_TABLET | Freq: Every day | ORAL | Status: DC
  Administered 2014-09-04 – 2014-09-05 (×2): 500 mg via ORAL
  Filled 2014-09-04 (×2): qty 1

## 2014-09-04 MED ORDER — SODIUM CHLORIDE 0.9 % IV SOLN
2000.0000 mg | Freq: Once | INTRAVENOUS | Status: AC
  Administered 2014-09-04: 2000 mg via INTRAVENOUS
  Filled 2014-09-04: qty 2000

## 2014-09-04 MED ORDER — CIPROFLOXACIN HCL 500 MG PO TABS: 500.0000 mg | ORAL_TABLET | Freq: Every day | ORAL | Status: AC

## 2014-09-04 MED ORDER — DOXERCALCIFEROL 4 MCG/2ML IV SOLN: 2.0000 ug | INTRAVENOUS | Status: AC

## 2014-09-04 MED ORDER — DEXTROSE 5 % IV SOLN
120.0000 mg | Freq: Once | INTRAVENOUS | Status: AC
  Administered 2014-09-04: 120 mg via INTRAVENOUS
  Filled 2014-09-04 (×2): qty 3

## 2014-09-04 MED ORDER — INSULIN ASPART 100 UNIT/ML ~~LOC~~ SOLN: 0.0000 [IU] | Freq: Three times a day (TID) | SUBCUTANEOUS | Status: AC

## 2014-09-04 NOTE — Progress Notes (Signed)
Inpatient Diabetes Program Recommendations  AACE/ADA: New Consensus Statement on Inpatient Glycemic Control (2013)  Target Ranges:  Prepandial:   less than 140 mg/dL      Peak postprandial:   less than 180 mg/dL (1-2 hours)      Critically ill patients:  140 - 180 mg/dL   Hypoglycemia persists even with reduction of 70/30 to half doses bid. Please consider changing the regimen for this time to basal levemir and correction per below recommendations.  Inpatient Diabetes Program Recommendations Insulin - Basal: total basal (NPH) from 8 units 70/30 bid converts to 10.6 units basal total. Please consider changing regimen to 10 units levemir plus sensitive correction tidwc as ordered.  Thank you, Noah CoffinAnn Deyani Hegarty, RN, CNS, Diabetes Coordinator [phone])

## 2014-09-04 NOTE — Procedures (Signed)
Patient was seen on dialysis and the procedure was supervised. BFR 400 [address] [facility] TDC BP is 153/77.  Patient appears to be tolerating treatment well.  He is supposed to be d/c'd to a SNF later today and [facility]  Dr. Kristian CoveyBefekadu aware of need for 6 weeks of vanc/gent and will send pharmacy's dosing recommendations.

## 2014-09-04 NOTE — Discharge Summary (Addendum)
Physician Discharge Summary  [name] AVW:[id number] DOB: [birth year] DOA: 08/27/2014  PCP: No primary provider on file.  Admit date: 08/27/2014 Discharge date: 09/05/2014  Time spent: 35 minutes  Recommendations for Outpatient Follow-up:  Monitor blood sugars- low in AM recommendations were made to start vancomycin and gentamicin post HD x 6 wks.  - recommend that [name] gets baseline hearing test to see if he has any ototoxicity associated with gentamicin, that may worsen his hearing.  - if he has worsening hearing or imbalance/vestibular issues, would d/c vanco and gent and switch to daptomycin dosed at 8mg /kg QOD, which would require a line  - would use Oct 8th as day 1 of 42 days schedule  - recommend he takes cipro 500mg  po daily x 4 wk for proteus bacteremia D/c to SNF  Discharge Diagnoses:  Principal Problem:   Cellulitis Active Problems:   ESRD needing dialysis   DM2 (diabetes mellitus, type 2)   HTN (hypertension)   Sepsis   Hypertension   CHF (congestive heart failure)   Peripheral vascular disease   GERD (gastroesophageal reflux disease)   Gout   Bacteremia   History of stroke   Discharge Condition: improved  Diet recommendation: renal  Filed Weights   09/04/14 1107 09/04/14 1515 09/04/14 2028  Weight: 98.5 kg (217 lb 2.5 oz) 96.1 kg (211 lb 13.8 oz) 97.1 kg (214 lb 1.1 oz)    History of present illness:  [name] is a 80 y.o. male with past medical history of COPD, type 2 diabetes, hypertension, end-stage renal disease on hemodialysis, history of TIA, hyperlipidemia, congestive heart failure, who presents with right upper arm pain, sweating and fever.  The patient was admitted to the [facility] hospital because of right arm swelling, tenderness and fever on 08/21/14. He was diagnosed with right arm cellulitis which is most likely secondary to fistula placement (he had AV graft placed over R upper arm by Dr. Jettie Booze on 9/8). He was found to have positive blood  culture with enterococcus and Proteus mirabilis which are sensitive to vancomycin and meropenem.  The patient was treated with IV vancomycin and meropenem with some clinical improvement in [facility]. Because patient needs vascular surgeon's evaluation and treatment, he is therefore transferred to [facility] for further evaluation and treatment. When I evaluated pt on the floor, patient has fever with temperature 101.3, and swelling and tender right upper arm over fistular placement area. His speech is not clear. He mumbles in answering my questions. I am not sure whether this is new issue or chronic issue.      Hospital Course:  Cellulitis in right arm, bacteremia, sepsis:  -due to infected AVG, s/p removal and removal of previous TDC  -bacteremia, with enterococcus fecalis and Proteus mirabilis. Improving. TTE without vegetation.  ID recs:VANCOMYCIN AND GENTAMICIN WITH HD X 6 WEEKS FOR THE ENTEROCOCCUS  AND ORAL CIPRO (RENALLY DOSED) FOR THE FAIRLY R PROTEUS X 4 WEEKS  CT scan of abdomen and pelvis done with IV and oral contrast to ensure No intra-abdominal source for current bacteremia that might cause recurrence down the road- scan remarkable for ?cystitis- patient does not make urine  HD in [place]- verifing that [place] will have ability to give the abx - Dr. Kristian Covey to monitor  Culture and sensitivities received from [facility]- Enterococcus Faecalis sensitive to erythromycin penicillin ampicillin vancomycin and linezolid, resistant to tetracycline. Proteus mirabilis sensitive to ciprofloxacin trimethoprim sulfa ampicillin sulbactam piperacillin/tazobactam gentamicin ertapenem meropenem, resistant to ceftriaxone, ampicillin, cefazolin,  cefepime, aztreonam.  DM-II:  SSI- low in AM  ESRD-HD: T/T/S.  HTN:  Controlled  GERD: continue PPI  Anemia secondary to CKD/infection: stable   Procedures: Rem[facility] of AV[facility] and [facility]   Consultations:  Renal  Vascular  ID  Discharge  Exam: Filed Vitals:   09/05/14 0929  BP: 152/70  Pulse: 63  Temp: 98.4 F (36.9 C)  Resp: 18    General: A+Ox3, NAD Cardiovascular: rrr Respiratory: clear  Discharge Instructions You were cared for by a hospitalist during your hospital stay. If you have any questions about your discharge medications or the care you received while you were in the hospital after you are discharged, you can call the unit and asked to speak with the hospitalist on call if the hospitalist that took care of you is not available. Once you are discharged, your primary care physician will handle any further medical issues. Please note that NO REFILLS for any discharge medications will be authorized once you are discharged, as it is imperative that you return to your primary care physician (or establish a relationship with a primary care physician if you do not have one) for your aftercare needs so that they can reassess your need for medications and monitor your lab values.  Discharge Instructions   Diet - low sodium heart healthy    Complete by:  As directed      Discharge instructions    Complete by:  As directed   recommend that [name] gets baseline hearing test to see if he has any ototoxicity associated with gentamicin, that may worsen his hearing. - if he has worsening hearing or imbalance/vestibular issues, would d/c vanco and gent and switch to daptomycin dosed at 8mg /kg QOD, which would require a line - would use Oct 8th as day 1 of 42 days schedule - recommend he takes cipro 500mg  po daily x 4 wk for proteus bacteremia Renal diet HD T/Th/Sat     Increase activity slowly    Complete by:  As directed           Current Discharge Medication List    START taking these medications   Details  ciprofloxacin (CIPRO) 500 MG tablet Take 1 tablet (500 mg total) by mouth daily. Qty: 30 tablet, Refills: 0    darbepoetin (ARANESP) 100 MCG/0.5ML SOLN injection Inject 0.5 mLs (100 mcg total) into the vein  every Wednesday with hemodialysis. Qty: 4.2 mL    doxercalciferol (HECTOROL) 4 MCG/2ML injection Inject 1 mL (2 mcg total) into the vein every Monday, Wednesday, and Friday with hemodialysis. Qty: 2 mL    gentamicin (GARAMYCIN) 1-0.9 MG/ML-% Inject 100 mLs (100 mg total) into the vein Every Tuesday,Thursday,and Saturday with dialysis. Qty: 100 mL, Refills: 12    insulin aspart (NOVOLOG) 100 UNIT/ML injection Inject 0-9 Units into the skin 3 (three) times daily with meals. Qty: 10 mL, Refills: 11    insulin aspart protamine- aspart (NOVOLOG MIX 70/30) (70-30) 100 UNIT/ML injection Inject 0.08 mLs (8 Units total) into the skin 2 (two) times daily with a meal. Qty: 10 mL, Refills: 11    multivitamin (RENA-VIT) TABS tablet Take 1 tablet by mouth at bedtime. Refills: 0    oxyCODONE-acetaminophen (PERCOCET/ROXICET) 5-325 MG per tablet Take 1-2 tablets by mouth every 4 (four) hours as needed for severe pain. Qty: 15 tablet, Refills: 0    sevelamer carbonate (RENVELA) 800 MG tablet Take 2 tablets (1,600 mg total) by mouth 3 (three) times daily with  meals.    vancomycin (VANCOCIN) 1 GM/200ML SOLN Inject 200 mLs (1,000 mg total) into the vein Every Tuesday,Thursday,and Saturday with dialysis. Qty: 4000 mL, Refills: 12      CONTINUE these medications which have NOT CHANGED   Details  allopurinol (ZYLOPRIM) 100 MG tablet Take 100 mg by mouth daily.    amLODipine (NORVASC) 10 MG tablet Take 10 mg by mouth daily.    aspirin EC 81 MG tablet Take 81 mg by mouth daily.    atorvastatin (LIPITOR) 20 MG tablet Take 20 mg by mouth daily at 6 PM.    carvedilol (COREG) 12.5 MG tablet Take 12.5 mg by mouth 2 (two) times daily with a meal.    clopidogrel (PLAVIX) 75 MG tablet Take 75 mg by mouth daily.    furosemide (LASIX) 40 MG tablet Take 120 mg by mouth daily.    loratadine (CLARITIN) 10 MG tablet Take 10 mg by mouth daily as needed for allergies.     metoCLOPramide (REGLAN) 5 MG tablet Take  5 mg by mouth 2 (two) times daily.    nitroGLYCERIN (NITRODUR - DOSED IN MG/24 HR) 0.4 mg/hr patch Place 0.4 mg onto the skin daily.    nitroGLYCERIN (NITROSTAT) 0.4 MG SL tablet Place 0.4 mg under the tongue every 5 (five) minutes as needed for chest pain.    omeprazole (PRILOSEC) 40 MG capsule Take 40 mg by mouth daily.    sodium bicarbonate 650 MG tablet Take 1,300 mg by mouth 2 (two) times daily.    sorbitol 70 % solution Take 30 mLs by mouth daily as needed (constipation).    sucralfate (CARAFATE) 1 G tablet Take 1 g by mouth 3 (three) times daily.    traMADol (ULTRAM) 50 MG tablet Take 1 tablet (50 mg total) by mouth every 6 (six) hours as needed. Qty: 20 tablet, Refills: 0    zolpidem (AMBIEN) 5 MG tablet Take 2.5 mg by mouth at bedtime as needed for sleep.      STOP taking these medications     calcitRIOL (ROCALTROL) 0.5 MCG capsule      folic acid-vitamin b complex-vitamin c-selenium-zinc (DIALYVITE) 3 MG TABS tablet      glipiZIDE (GLUCOTROL XL) 10 MG 24 hr tablet      insulin lispro protamine-lispro (HUMALOG 75/25 MIX) (75-25) 100 UNIT/ML SUSP injection      potassium chloride 20 MEQ/15ML (10%) solution        Allergies  Allergen Reactions  . Hydrocodone Other (See Comments)    Upset stomach  . Penicillins Other (See Comments)    Upset stomach   Follow-up Information   Follow up with Chuck Hint, MD On 09/19/2014. (Appt is at 12:45pm )    Specialty:  Vascular Surgery   Contact information:   [address] [place] [address] [phone]       Please follow up. (HD T/Th/Sat)        The results of significant diagnostics from this hospitalization (including imaging, microbiology, ancillary and laboratory) are listed below for reference.    Significant Diagnostic Studies: Ct Abdomen Pelvis W Contrast  09/03/2014   CLINICAL DATA:  G negative bacteremia. Right arm cellulitis and sepsis. End-stage renal disease on hemodialysis with recent  right upper extremity AV graft placement.  EXAM: CT ABDOMEN AND PELVIS WITH CONTRAST  TECHNIQUE: Multidetector CT imaging of the abdomen and pelvis was performed using the standard protocol following bolus administration of intravenous contrast.  CONTRAST:  OMNIPAQUE IOHEXOL 300 MG/ML  SOLN  COMPARISON:  08/18/2014  FINDINGS: There are 3 small nodules in the right middle lobe measuring up to 3 mm in size, not clearly present on multiple prior examinations. There is also a 4 mm nodule along the right major fissure. Subsegmental atelectasis is present in the lung bases, and there are small right and trace left pleural effusions. Right pleural effusion appears slightly smaller than on the prior study. LAD and right coronary artery calcifications are noted. Central venous catheter terminates in the high right atrium.  Punctate calcification in the posterior right hepatic lobe is unchanged. Gallbladder is surgically absent. No biliary dilatation is seen. Multiple subcentimeter splenules are noted. The adrenal glands and pancreas have an unremarkable enhanced appearance. 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  Oral contrast is seen in multiple loops of nondilated small and large bowel to the level of the rectum without evidence of obstruction. Scattered colonic diverticulosis is noted without evidence of diverticulitis. Appendix is unremarkable. Bladder appears thick-walled, however evaluation is limited by underdistention.  Focal subcutaneous fat stranding in the right lower anterior abdominal wall is unchanged. Small foci of air in the lower anterior abdominal wall may reflect recent subcutaneous injections. Small calcified granulomas are noted in the right gluteal region. Small gastrohepatic lymph nodes measure up to 1.3 cm in short axis, not significantly changed and likely reactive. Mild aortoiliac calcification is noted. No free fluid is seen. Thoracolumbar spondylosis is noted.  IMPRESSION: 1.  Circumferential bladder wall thickening, query cystitis. 2. Small right pleural effusion, slightly decreased from prior. 3. Several sub 5 mm nodules in the right lung base, likely infectious/inflammatory. If the patient is at high risk for bronchogenic carcinoma, follow-up chest CT at 1 year is recommended. If the patient is at low risk, no follow-up is needed. This recommendation follows the consensus statement: Guidelines for Management of Small Pulmonary Nodules Detected on CT Scans: A Statement from the Fleischner Society as published in Radiology 2005; 237:395-400.   Electronically Signed   By: Sebastian Ache   On: 09/03/2014 09:27   Ir Removal Tun Cv Cath W/o Fl  08/29/2014   CLINICAL DATA:  Bacteremia, request for right tunneled IJ HD catheter removal.  EXAM: REMOVAL TUNNELED CENTRAL VENOUS CATHETER  PROCEDURE: The patient's right chest and catheter was prepped and draped in a normal sterile fashion. Heparin was removed from both ports of catheter. 1% lidocaine was used for local anesthesia. Using gentle blunt dissection the cuff of the catheter was exposed and the catheter was removed in it's entirety. Pressure was held till hemostasis was obtained. A sterile dressing was applied. The patient tolerated the procedure well with no immediate complications.  COMPLICATIONS: None immediate.  IMPRESSION: Successful catheter removal as described above.  Read By:  Pattricia Boss PA-C   Electronically Signed   By: Irish Lack M.D.   On: 08/29/2014 13:18   Dg Chest Port 1v Same Day  08/31/2014   CLINICAL DATA:  Evaluate port placement  EXAM: PORTABLE CHEST - 1 VIEW SAME DAY  COMPARISON:  None.  FINDINGS: Dual-lumen left-sided central venous catheter with the distal tip projecting over the right atrium and the more proximal tip over the SVC.  There is no focal parenchymal opacity, pleural effusion, or pneumothorax. There is stable cardiomegaly. The osseous structures are unremarkable.  IMPRESSION: Dual-lumen  left-sided central venous catheter with the distal tip projecting over the right atrium and the more proximal tip over the SVC. No pneumothorax.   Electronically Signed   By:  Elige KoHetal  Patel   On: 08/31/2014 14:46   Dg Fluoro Guide Cv Line-no Report  08/31/2014   CLINICAL DATA:    FLOURO GUIDE CV LINE  Fluoroscopy was utilized by the requesting physician.  No radiographic  interpretation.     Microbiology: Recent Results (from the past 240 hour(s))  CULTURE, BLOOD (ROUTINE X 2)     Status: None   Collection Time    08/27/14 10:21 PM      Result Value Ref Range Status   Specimen Description BLOOD LEFT HAND   Final   Special Requests     Final   Value: BOTTLES DRAWN AEROBIC AND ANAEROBIC 5CC BLUE,4CC RED   Culture  Setup Time     Final   Value: 08/28/2014 04:13     Performed at [facility]   Culture     Final   Value: NO GROWTH 5 DAYS     Performed at [facility]   Report Status 09/03/2014 FINAL   Final  CULTURE, BLOOD (ROUTINE X 2)     Status: None   Collection Time    08/27/14 10:43 PM      Result Value Ref Range Status   Specimen Description BLOOD LEFT HAND   Final   Special Requests BOTTLES DRAWN AEROBIC ONLY 5CC   Final   Culture  Setup Time     Final   Value: 08/28/2014 04:11     Performed at [facility]   Culture     Final   Value: NO GROWTH 5 DAYS     Performed at [facility]   Report Status 09/03/2014 FINAL   Final  ANAEROBIC CULTURE     Status: None   Collection Time    08/28/14 10:32 AM      Result Value Ref Range Status   Specimen Description WOUND GRAFT   Final   Special Requests RIGHT UPPER ARM PERIGRAFT PT ON VANCO MEROPENEM   Final   Gram Stain     Final   Value: RARE WBC PRESENT,BOTH PMN AND MONONUCLEAR     NO SQUAMOUS EPITHELIAL CELLS SEEN     NO ORGANISMS SEEN     Performed at [facility]   Culture     Final   Value: NO ANAEROBES ISOLATED     Performed at [facility]   Report Status 09/02/2014 FINAL    Final  WOUND CULTURE     Status: None   Collection Time    08/28/14 10:32 AM      Result Value Ref Range Status   Specimen Description WOUND GRAFT   Final   Special Requests RIGHT UPPER ARM PERIGRAFT PT ON VANCO MEROPENEM   Final   Gram Stain     Final   Value: RARE WBC PRESENT,BOTH PMN AND MONONUCLEAR     NO SQUAMOUS EPITHELIAL CELLS SEEN     NO ORGANISMS SEEN     Performed at [facility]   Culture     Final   Value: RARE ENTEROCOCCUS SPECIES     Performed at [facility]   Report Status 09/01/2014 FINAL   Final   Organism ID, Bacteria ENTEROCOCCUS SPECIES   Final  SURGICAL PCR SCREEN     Status: None   Collection Time    08/31/14  6:25 AM      Result Value Ref Range Status   MRSA, PCR NEGATIVE  NEGATIVE Final   Staphylococcus aureus NEGATIVE  NEGATIVE Final  Comment:            The Xpert SA Assay (FDA     approved for NASAL specimens     in patients over 32 years of age),     is one component of     a comprehensive surveillance     program.  Test performance has     been validated by [employer] for patients greater     than or equal to 40 year old.     It is not intended     to diagnose infection nor to     guide or monitor treatment.     Labs: Basic Metabolic Panel:  Recent Labs Lab 08/31/14 1630 09/04/14 1128  NA 134* 134*  K 4.2 3.8  CL 93* 93*  CO2 27 27  GLUCOSE 85 107*  BUN 36* 24*  CREATININE 6.36* 6.92*  CALCIUM 8.0* 8.5  PHOS  --  5.0*   Liver Function Tests:  Recent Labs Lab 09/04/14 1128  ALBUMIN 2.0*   No results found for this basename: LIPASE, AMYLASE,  in the last 168 hours No results found for this basename: AMMONIA,  in the last 168 hours CBC:  Recent Labs Lab 08/30/14 0630 08/31/14 1630 09/04/14 1128  WBC 7.5 7.2 6.2  HGB 7.5* 8.1* 7.5*  HCT 23.3* 25.3* 23.2*  MCV 90.7 93.4 90.3  PLT PLATELET CLUMPS NOTED ON SMEAR, COUNT APPEARS ADEQUATE 262 292   Cardiac Enzymes: No results found for this  basename: CKTOTAL, CKMB, CKMBINDEX, TROPONINI,  in the last 168 hours BNP: BNP (last 3 results) No results found for this basename: PROBNP,  in the last 8760 hours CBG:  Recent Labs Lab 09/04/14 1039 09/04/14 1642 09/04/14 2025 09/05/14 0740 09/05/14 0841  GLUCAP 119* 119* 114* 64* 78       Signed:  Craigory Toste  [facility] 09/05/2014, 10:51 AM

## 2014-09-04 NOTE — Progress Notes (Signed)
Patient's BS was 64 this am. Juice and breakfast given.  BS now 114.

## 2014-09-04 NOTE — Care Management Note (Signed)
CARE MANAGEMENT NOTE 09/04/2014  Patient:  [name],[name]   Account Number:  [id number]  Date Initiated:  09/03/2014  Documentation initiated by:  Darlyne RussianOLAND,ANNETTE  Subjective/Objective Assessment:   admitted with right arm cellulitis  medical WU:JWJX/BJhx:ESRD/HD, COPD, CHF, DM     Action/Plan:   progression of care and discharge planning  09/03/2014 [facility], they can start Antibiotics with HD on Thur, need prescriptions. Dr Benjamine MolaVann notified.   Anticipated DC Date:  09/04/2014   Anticipated DC Plan:  SKILLED NURSING FACILITY      DC Planning Services  CM consult      [facility]   Choice offered to / List presented to:  C-1 Patient           Status of service:  Completed, signed off Medicare Important Message given?  YES (If response is "NO", the following Medicare IM given date fields will be blank) Date Medicare IM given:  09/03/2014 Medicare IM given by:  Donnah Levert Date Additional Medicare IM given:   Additional Medicare IM given by:    Discharge Disposition:  SKILLED NURSING FACILITY  Per UR Regulation:    If discussed at Long Length of Stay Meetings, dates discussed:    Comments:  09/03/2014  [address] Annette Roland RN, [place]  [phone] [facility]  [phone] patient was discharged from service 07/18/2014 able to provide home health PT OT. request fax: face to face, demo, H&P, last progress notes FAX:  [phone]  09/03/2104  [address] Annette Roland RN, [place] [phone]  [facility]: fax [phone] sent request to call regarding status of home health services

## 2014-09-05 LAB — GLUCOSE, CAPILLARY
GLUCOSE-CAPILLARY: 64 mg/dL — AB (ref 70–99)
GLUCOSE-CAPILLARY: 78 mg/dL (ref 70–99)
Glucose-Capillary: 78 mg/dL (ref 70–99)

## 2014-09-05 NOTE — Progress Notes (Signed)
Reported given [facility].  Jonell CluckKadeesha Hanad Leino, RN

## 2014-09-05 NOTE — Progress Notes (Signed)
          Hypoglycemic Event  CBG: 64 at 0740am   Treatment: 15 GM carbohydrate snack  Symptoms: None  Follow-up CBG: Time:0841am CBG Result:78  Possible Reasons for Event: Unknown  Comments/MD notified:Dr. Francetta FoundVann    [name] M  Remember to initiate Hypoglycemia Order Set & complete

## 2014-09-05 NOTE — Progress Notes (Signed)
  Progress Note    09/05/2014 8:07 AM 5 Days Post-Op  Subjective:  States his arm is not sore  Afebrile x 24 hrs  Filed Vitals:   09/05/14 0442  BP: 141/65  Pulse: 69  Temp: 97.6 F (36.4 C)  Resp: 18    Physical Exam: Lungs:  Non labored Incisions:  C/d/i-no drainage on bandage Extremities:  No erythema around incision  CBC    Component Value Date/Time   WBC 6.2 09/04/2014 1128   RBC 2.57* 09/04/2014 1128   RBC 2.71* 08/31/2014 1630   HGB 7.5* 09/04/2014 1128   HCT 23.2* 09/04/2014 1128   PLT 292 09/04/2014 1128   MCV 90.3 09/04/2014 1128   MCH 29.2 09/04/2014 1128   MCHC 32.3 09/04/2014 1128   RDW 14.5 09/04/2014 1128   LYMPHSABS 1.7 08/27/2014 2243   MONOABS 0.9 08/27/2014 2243   EOSABS 0.2 08/27/2014 2243   BASOSABS 0.0 08/27/2014 2243    BMET    Component Value Date/Time   NA 134* 09/04/2014 1128   K 3.8 09/04/2014 1128   CL 93* 09/04/2014 1128   CO2 27 09/04/2014 1128   GLUCOSE 107* 09/04/2014 1128   BUN 24* 09/04/2014 1128   CREATININE 6.92* 09/04/2014 1128   CALCIUM 8.5 09/04/2014 1128   GFRNONAA 7* 09/04/2014 1128   GFRAA 8* 09/04/2014 1128    INR    Component Value Date/Time   INR 1.17 08/27/2014 2243     Intake/Output Summary (Last 24 hours) at 09/05/14 0807 Last data filed at 09/05/14 0646  Gross per 24 hour  Intake    360 ml  Output   2500 ml  Net  -2140 ml     Assessment:  80 y.o. male is s/p:  1. Removal of infected right upper arm AV graft  2. Vein patch angioplasty of the right brachial artery 8 Days Post-Op And   #1 bilateral ultrasound localization internal jugular veins  #2 insertion of tunneled hemodialysis catheter via left IJ-Diateck -27 cm 5 Days Post-Op  Plan: -incision is healing nicely--will get sutures out today -continue ABx per primary team -f/u with Dr. Edilia Boickson in 2 weeks-Has appt 09/19/14 at 12:45   Doreatha MassedSamantha Rhyne, PA-C [facility] [phone] 09/05/2014 8:07 AM

## 2014-09-05 NOTE — Progress Notes (Signed)
Patient d/c was held for today as SNF was unable to get meds so late.  Patient to be discharged today to SNF.  Sutures removed today.   Noah CanaryJessica Tuana Hoheisel

## 2014-09-05 NOTE — Progress Notes (Signed)
Five sutures removed from right upper arm incision.  Peri MarisAndrew Calise Dunckel, MBA, BS, RN

## 2014-09-05 NOTE — Progress Notes (Signed)
Patient Discharge:  [facility]  [facility] Report called to charge nurse  IV: Removed  Telemetry: N/A  Transportation: Transported to SNF via ambulance  Belongings: All belongings taken with pt.

## 2014-09-05 NOTE — Progress Notes (Signed)
Physical Therapy Treatment Patient Details Name: [name] MRN: [id number] DOB: [birth year] Today's Date: 09/05/2014    History of Present Illness HPI: [name] is a 80 y.o. male with past medical history of COPD, type 2 diabetes, hypertension, end-stage renal disease on hemodialysis, history of TIA, hyperlipidemia, congestive heart failure, who presents with right upper arm pain, sweating and fever. Perm HD cath out of right upper arm on 10/7 with plans for new one on 10/9    PT Comments    Good progress with mobility, activity tolerance and amb distance; noted plan for dc to SNF today  Follow Up Recommendations  SNF;Supervision/Assistance - 24 hour     Equipment Recommendations  Rolling walker with 5" wheels;3in1 (PT)    Recommendations for Other Services       Precautions / Restrictions Precautions Precautions: Fall Precaution Comments: h/o  R UE fracture with falls    Mobility  Bed Mobility               General bed mobility comments: recieved in chair  Transfers Overall transfer level: Needs assistance Equipment used: Rolling walker (2 wheeled) Transfers: Sit to/from Stand Sit to Stand: Min guard (with and without phsyical contact)         General transfer comment: cues for out at edge and increased time to scoot forward, continued heavy UE use to power up, but noted smoother today  Ambulation/Gait Ambulation/Gait assistance: Min guard Ambulation Distance (Feet): 160 Feet (with 1 seated rest break) Assistive device: Rolling walker (2 wheeled) Gait Pattern/deviations: Decreased step length - right;Decreased step length - left;Trunk flexed Gait velocity: slowed   General Gait Details: Cues for upright posture, and to self-monitor for activity tolerance; pulled chair behind and pt took on seated rest break; Able to double amb distance today   Stairs            Wheelchair Mobility    Modified Rankin (Stroke Patients Only)       Balance              Standing balance-Leahy Scale: Poor                      Cognition Arousal/Alertness: Awake/alert Behavior During Therapy: WFL for tasks assessed/performed Overall Cognitive Status: Within Functional Limits for tasks assessed                      Exercises      General Comments        Pertinent Vitals/Pain Pain Assessment: No/denies pain    Home Living                      Prior Function            PT Goals (current goals can now be found in the care plan section) Acute Rehab PT Goals Patient Stated Goal: to go home once he is ready Time For Goal Achievement: 09/12/14 Potential to Achieve Goals: Good Progress towards PT goals: Progressing toward goals    Frequency  Min 3X/week    PT Plan Discharge plan needs to be updated    Co-evaluation             End of Session Equipment Utilized During Treatment: Gait belt Activity Tolerance: Patient limited by fatigue Patient left: in chair;with chair alarm set;with call bell/phone within reach     Time: 1002-1025 PT Time Calculation (min): 23 min  Charges:  $Gait Training: 23-37  mins                    G Codes:      [name], [name] 09/05/2014, 1:18 PM  Noah ClinesHolly Chanette Juarez, [place]PT  Acute Rehabilitation Services Pager [phone] Office [phone]

## 2014-09-07 NOTE — Anesthesia Postprocedure Evaluation (Signed)
  Anesthesia Post-op Note  Patient: [name]  Procedure(s) Performed: Procedure(s): INSERTION OF DIALYSIS CATHETER LEFT INTERNAL JUGULAR VEIN (N/A)  Patient Location: PACU  Anesthesia Type:General  Level of Consciousness: awake, alert  and oriented  Airway and Oxygen Therapy: Patient Spontanous Breathing and Patient connected to nasal cannula oxygen  Post-op Pain: none  Post-op Assessment: Post-op Vital signs reviewed, Patient's Cardiovascular Status Stable, Respiratory Function Stable, Patent Airway and No signs of Nausea or vomiting  Post-op Vital Signs: stable  Last Vitals:  Filed Vitals:   09/05/14 0929  BP: 152/70  Pulse: 63  Temp: 36.9 C  Resp: 18    Complications: No apparent anesthesia complications

## 2014-09-18 ENCOUNTER — Encounter: Payer: Self-pay | Admitting: Vascular Surgery

## 2014-09-19 ENCOUNTER — Encounter: Payer: Medicare Other | Admitting: Vascular Surgery

## 2014-09-25 ENCOUNTER — Encounter: Payer: Self-pay | Admitting: Vascular Surgery

## 2014-09-26 ENCOUNTER — Encounter: Payer: Medicare Other | Admitting: Vascular Surgery

## 2014-10-11 ENCOUNTER — Inpatient Hospital Stay: Payer: Medicare Other | Admitting: Infectious Disease

## 2014-10-15 ENCOUNTER — Inpatient Hospital Stay: Payer: Medicare Other | Admitting: Infectious Disease

## 2014-10-23 ENCOUNTER — Encounter: Payer: Self-pay | Admitting: Vascular Surgery

## 2014-10-24 ENCOUNTER — Encounter: Payer: Medicare Other | Admitting: Vascular Surgery

## 2014-10-26 ENCOUNTER — Ambulatory Visit (HOSPITAL_COMMUNITY)
Admission: RE | Admit: 2014-10-26 | Discharge: 2014-10-26 | Disposition: A | Discharge status: Home / Self Care | Payer: Medicare Other | Source: Ambulatory Visit | Attending: Nephrology | Admitting: Nephrology

## 2014-10-26 ENCOUNTER — Other Ambulatory Visit (HOSPITAL_COMMUNITY): Payer: Self-pay | Admitting: Nephrology

## 2014-10-26 DIAGNOSIS — Y838 Other surgical procedures as the cause of abnormal reaction of the patient, or of later complication, without mention of misadventure at the time of the procedure: Secondary | ICD-10-CM | POA: Insufficient documentation

## 2014-10-26 DIAGNOSIS — N186 End stage renal disease: Secondary | ICD-10-CM

## 2014-10-26 DIAGNOSIS — T82868A Thrombosis of vascular prosthetic devices, implants and grafts, initial encounter: Secondary | ICD-10-CM | POA: Insufficient documentation

## 2014-10-26 MED ORDER — VANCOMYCIN HCL IN DEXTROSE 1-5 GM/200ML-% IV SOLN
1000.0000 mg | Freq: Once | INTRAVENOUS | Status: AC
  Administered 2014-10-26: 1000 mg via INTRAVENOUS
  Filled 2014-10-26: qty 200

## 2014-10-26 MED ORDER — IOHEXOL 300 MG/ML  SOLN
50.0000 mL | Freq: Once | INTRAMUSCULAR | Status: AC | PRN
  Administered 2014-10-26: 1 mL via INTRAVENOUS

## 2016-04-01 IMAGING — US IR FLUORO GUIDE CV LINE*R*
1 series · 1 of 1 positions shown · non-contrast
Comparison: none

CLINICAL DATA: ESRD needing dialysis
TECHNIQUE: RIGHT IJ CATHETER PLACEMENT UNDER ULTRASOUND AND FLUOROSCOPIC
GUIDANCE

[Series 1: ir fluoro guide cv line*right* · 1 of 1 slices shown]
[im 1/1]
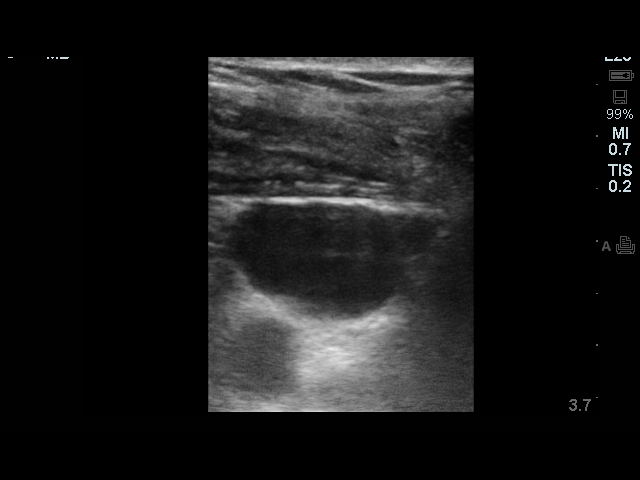

[1 of 1 positions shown; findings below may reference images not displayed]

The procedure, risks (including but not limited to bleeding,
infection, organ damage, pneumothorax), benefits, and alternatives
were explained to the patient. Questions regarding the procedure
were encouraged and answered. The patient understands and consents
to the procedure. Patency of the right IJ vein was confirmed with
ultrasound with image documentation. An appropriate skin site was
determined. Skin site was marked. Region was prepped using maximum
barrier technique including cap and mask, sterile gown, sterile
gloves, large sterile sheet, and Chlorhexidine as cutaneous
antisepsis. The region was infiltrated locally with 1% lidocaine.
Under real-time ultrasound guidance, the right IJ vein was accessed
with a 19 gauge needle; the needle tip within the vein was confirmed
with ultrasound image documentation. The needle exchanged over a
guidewire for vascular dilator which allowed advancement of a 20 cm
Trialysis catheter. This was positioned with the tip at the
cavoatrial junction. Spot chest radiograph shows good positioning
and no pneumothorax. Catheter was flushed and sutured externally
with 0-Prolene sutures. Patient tolerated the procedure well, with
no immediate complication.
IMPRESSION: 1. Technically successful right IJ Trialysis catheter placement.

## 2016-04-03 IMAGING — CR DG CHEST 1V PORT
1 series · 1 of 1 positions shown · non-contrast
Comparison: 06/11/2014

CLINICAL DATA: Right central line placement.

EXAM:
PORTABLE CHEST - 1 VIEW

[ap]
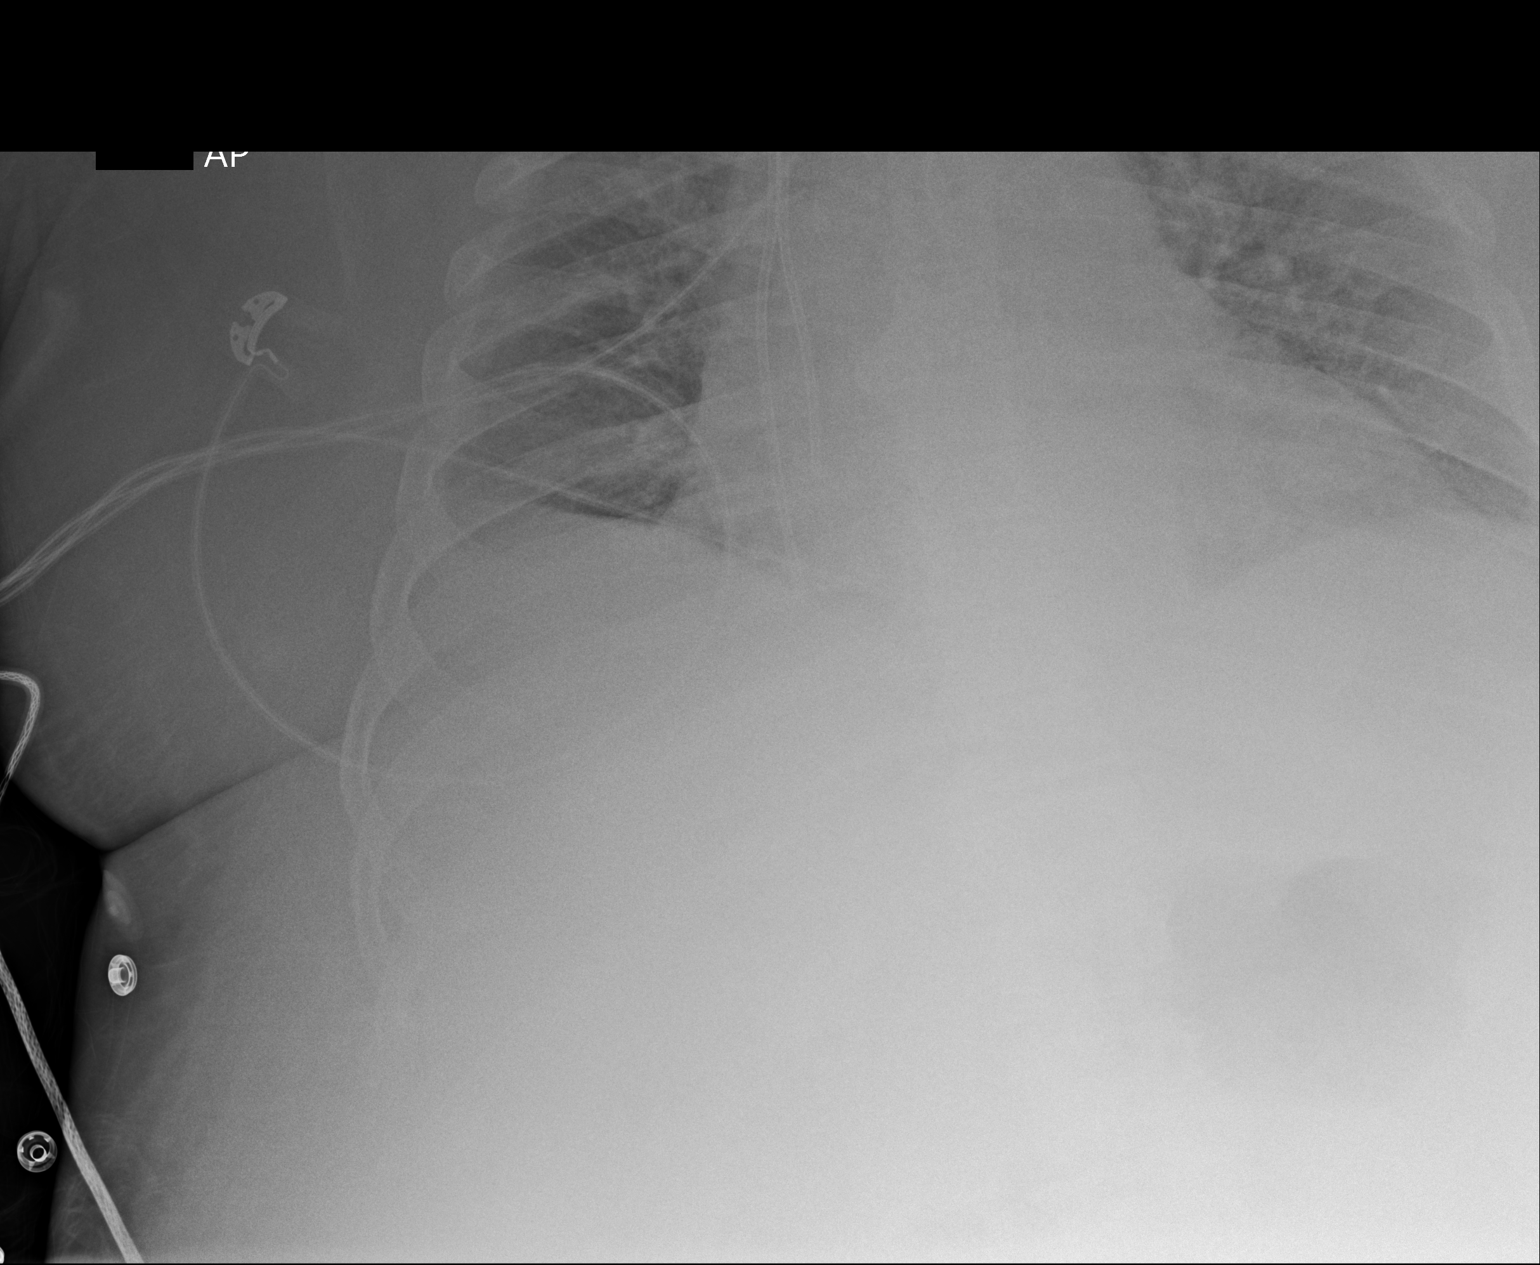

[1 of 1 positions shown; findings below may reference images not displayed]

FINDINGS: Right dialysis catheter is in place. The tips are in the right
atrium. No pneumothorax.

Cardiomegaly with vascular congestion and interstitial prominence,
likely mild interstitial edema. No visible effusions or acute bony
abnormality.
IMPRESSION: Right dialysis catheter tips in the right atrium. No visible
pneumothorax.

Mild interstitial edema/CHF.

## 2016-06-22 IMAGING — CT CT ABD-PELV W/ CM
2 of 5 series · 16 of 46 positions shown, 18 images · IV contrast (Omni 300)
Comparison: 08/18/2014

CLINICAL DATA: G negative bacteremia. Right arm cellulitis and
sepsis. End-stage renal disease on hemodialysis with recent right
upper extremity AV graft placement.

EXAM:
CT ABDOMEN AND PELVIS WITH CONTRAST
TECHNIQUE: Multidetector CT imaging of the abdomen and pelvis was performed
using the standard protocol following bolus administration of
intravenous contrast.
CONTRAST:  100mL OMNIPAQUE IOHEXOL 300 MG/ML  SOLN

[Series 2: abd/ pelvis 5.0 i30f 1 · axial · 0.98mm/px · z∈[+856,+1291]mm · 13 of 99 slices shown, 15 images]
[im 6/99  soft-tissue]
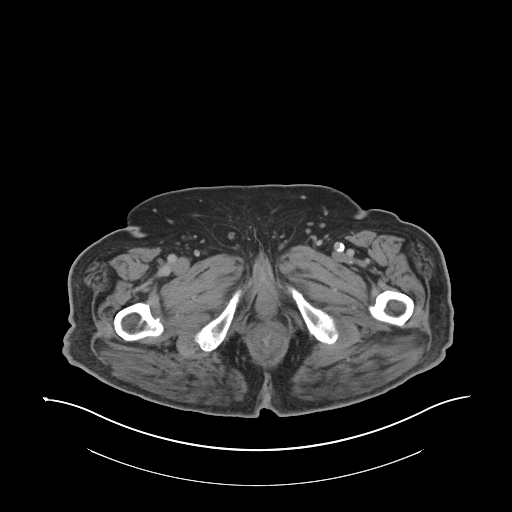
[im 6/99  bone]
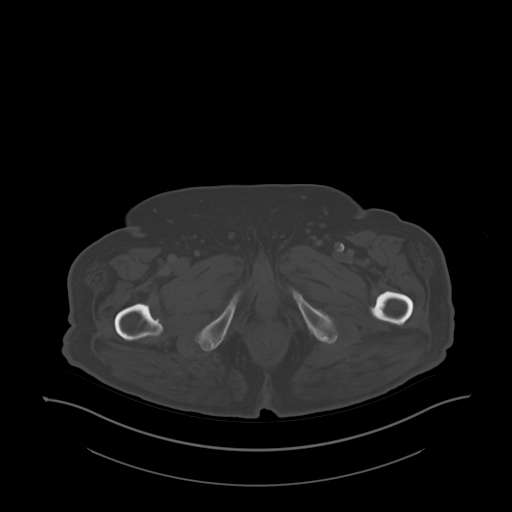
[im 16/99  soft-tissue]
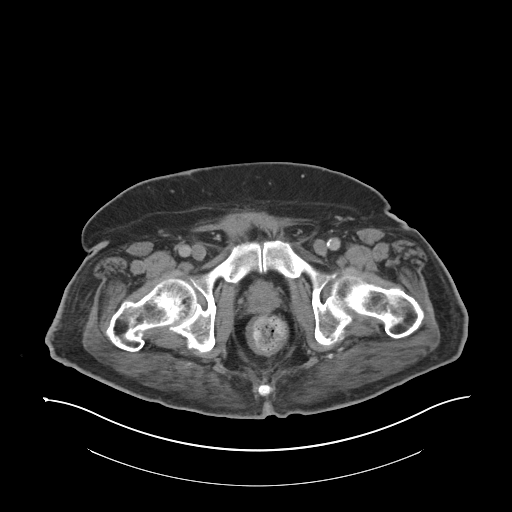
[im 21/99  soft-tissue]
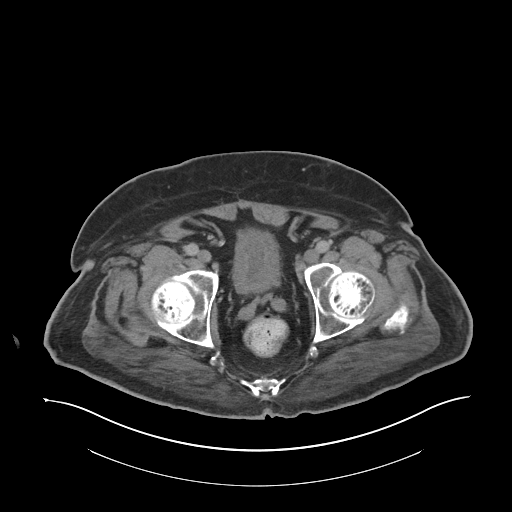
[im 26/99  soft-tissue]
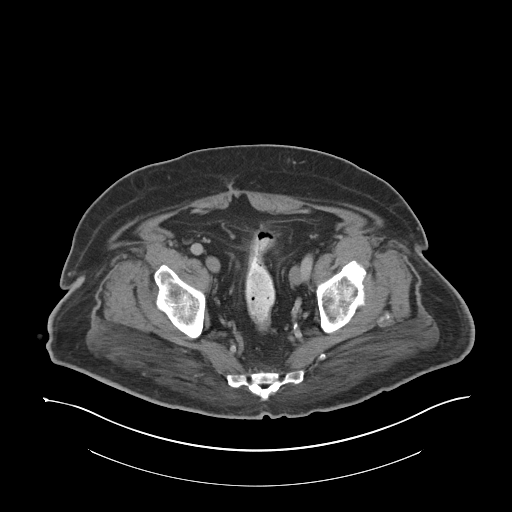
[im 37/99  soft-tissue]
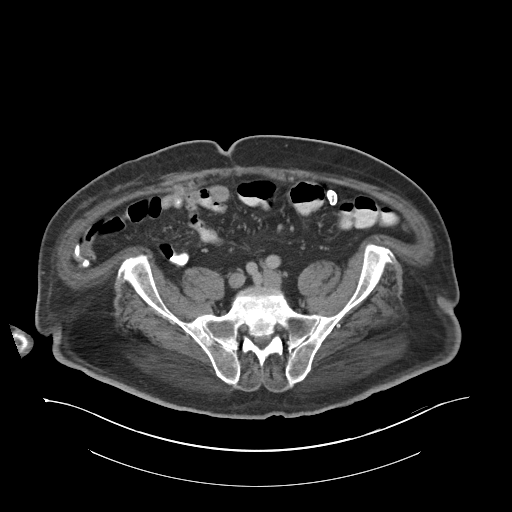
[im 42/99  soft-tissue]
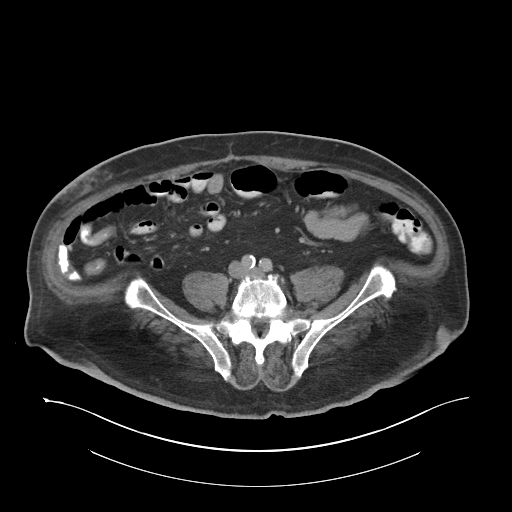
[im 52/99  soft-tissue]
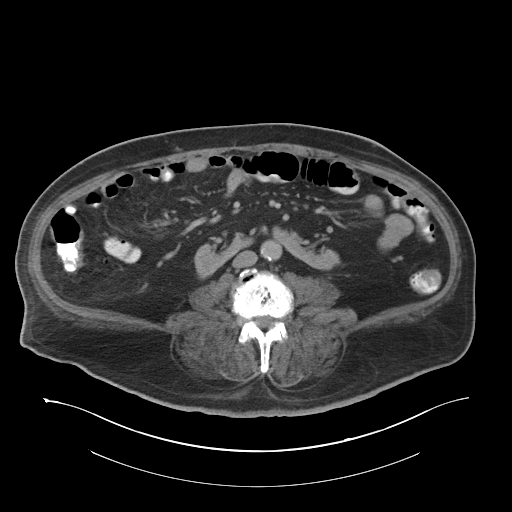
[im 57/99  soft-tissue]
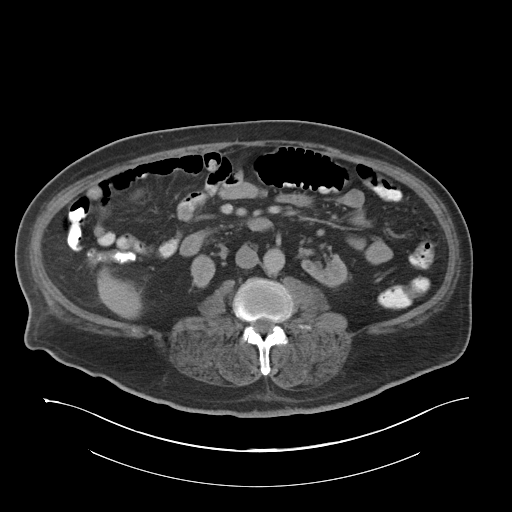
[im 62/99  soft-tissue]
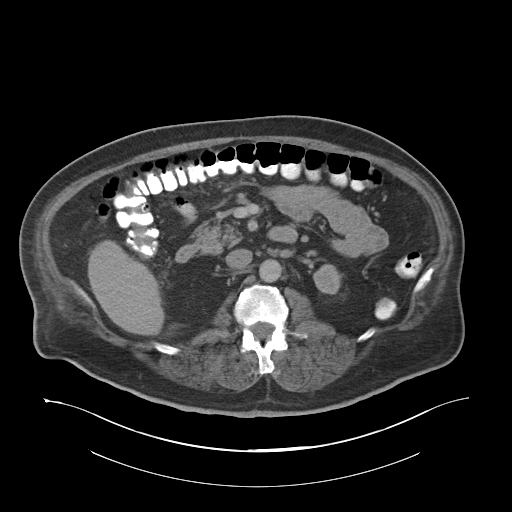
[im 62/99  bone]
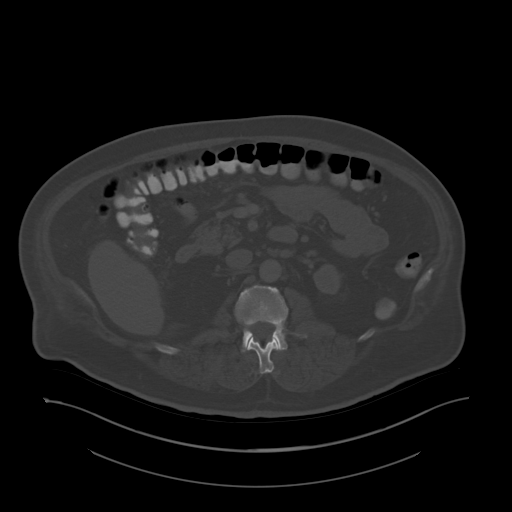
[im 73/99  soft-tissue]
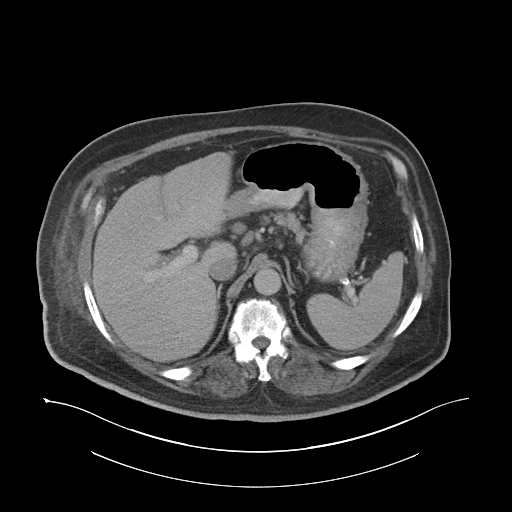
[im 78/99  soft-tissue]
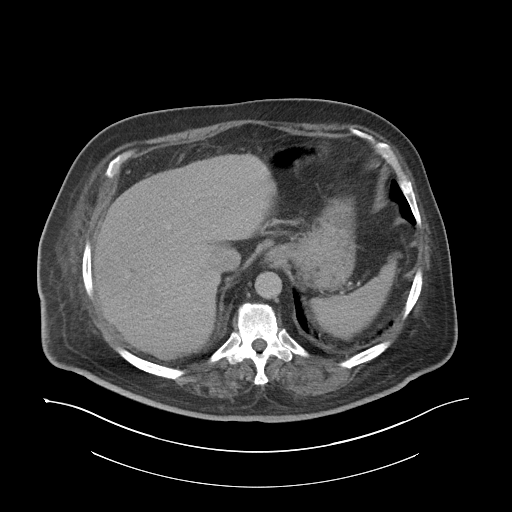
[im 83/99  soft-tissue]
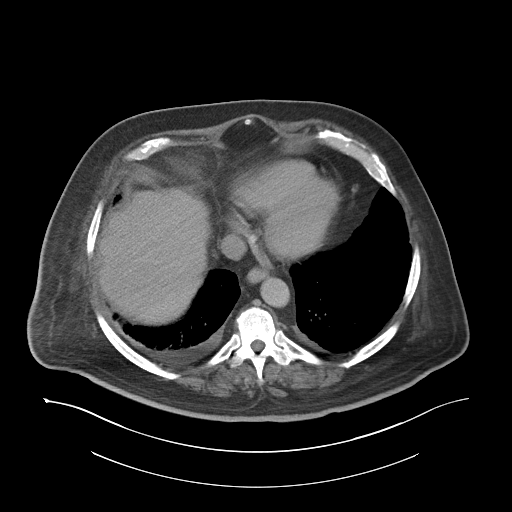
[im 93/99  soft-tissue]
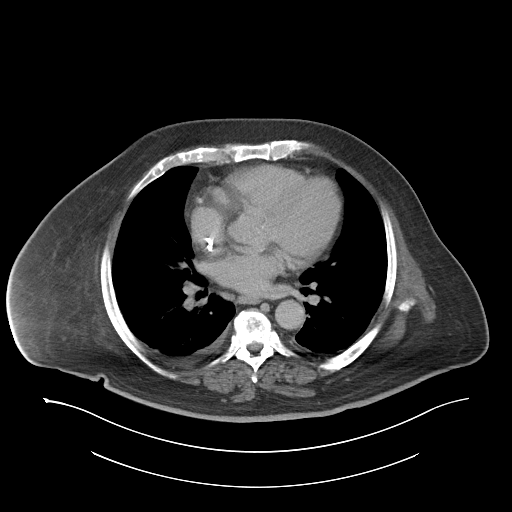

[Series 5: coronals · coronal · 1.02mm/px · 3 of 149 slices shown]
[im 50/149  soft-tissue]
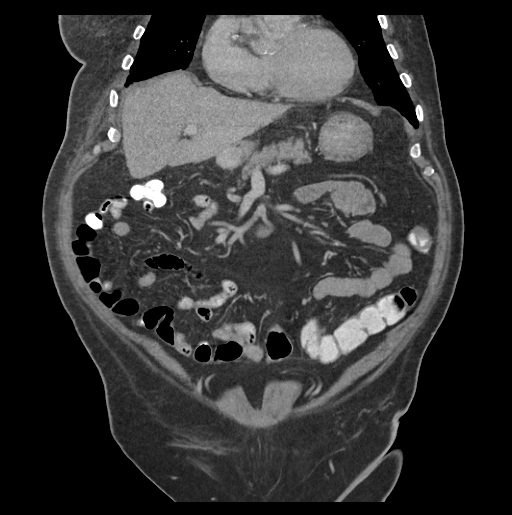
[im 66/149  soft-tissue]
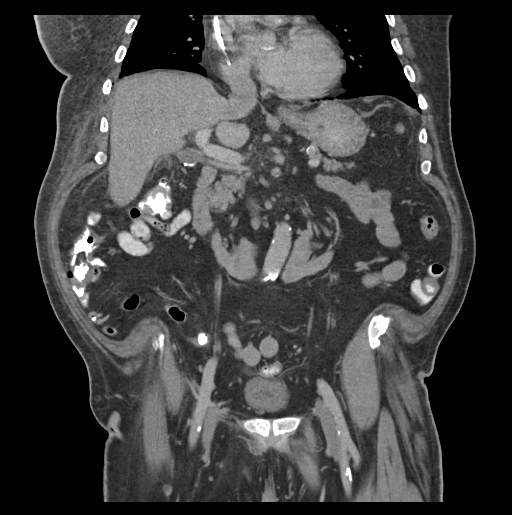
[im 83/149  soft-tissue]
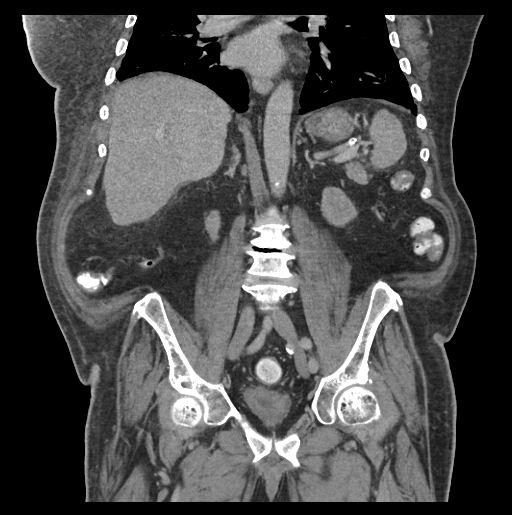

[16 of 46 positions shown; findings below may reference images not displayed]

FINDINGS: There are 3 small nodules in the right middle lobe measuring up to 3
mm in size, not clearly present on multiple prior examinations.
There is also a 4 mm nodule along the right major fissure.
Subsegmental atelectasis is present in the lung bases, and there are
small right and trace left pleural effusions. Right pleural effusion
appears slightly smaller than on the prior study. LAD and right
coronary artery calcifications are noted. Central venous catheter
terminates in the high right atrium.

Punctate calcification in the posterior right hepatic lobe is
unchanged. Gallbladder is surgically absent. No biliary dilatation
is seen. Multiple subcentimeter splenules are noted. The adrenal
glands and pancreas have an unremarkable enhanced appearance.
Atrophic, horseshoe kidney is again seen.

Oral contrast is seen in multiple loops of nondilated small and
large bowel to the level of the rectum without evidence of
obstruction. Scattered colonic diverticulosis is noted without
evidence of diverticulitis. Appendix is unremarkable. Bladder
appears thick-walled, however evaluation is limited by
underdistention.

Focal subcutaneous fat stranding in the right lower anterior
abdominal wall is unchanged. Small foci of air in the lower anterior
abdominal wall may reflect recent subcutaneous injections. Small
calcified granulomas are noted in the right gluteal region. Small
gastrohepatic lymph nodes measure up to 1.3 cm in short axis, not
significantly changed and likely reactive. Mild aortoiliac
calcification is noted. No free fluid is seen. Thoracolumbar
spondylosis is noted.
IMPRESSION: 1. Circumferential bladder wall thickening, query cystitis.
2. Small right pleural effusion, slightly decreased from prior.
3. Several sub 5 mm nodules in the right lung base, likely
infectious/inflammatory. If the patient is at high risk for
bronchogenic carcinoma, follow-up chest CT at 1 year is recommended.
If the patient is at low risk, no follow-up is needed. This
recommendation follows the consensus statement: Guidelines for
Management of Small Pulmonary Nodules Detected on CT Scans: A
Statement from the [HOSPITAL] as published in Radiology
